# Patient Record
Sex: Female | Born: 1975 | Race: White | Hispanic: No | Marital: Single | Smoking: Former smoker
Health system: Southern US, Community
[De-identification: ages and names within clinical notes are randomized; demographics above are authoritative.]

## PROBLEM LIST (undated history)

## (undated) DIAGNOSIS — M549 Dorsalgia, unspecified: Secondary | ICD-10-CM

## (undated) DIAGNOSIS — K812 Acute cholecystitis with chronic cholecystitis: Secondary | ICD-10-CM

## (undated) DIAGNOSIS — G8929 Other chronic pain: Secondary | ICD-10-CM

## (undated) DIAGNOSIS — F1910 Other psychoactive substance abuse, uncomplicated: Secondary | ICD-10-CM

## (undated) DIAGNOSIS — K819 Cholecystitis, unspecified: Secondary | ICD-10-CM

## (undated) HISTORY — PX: BACK SURGERY: SHX140

---

## 2001-05-22 ENCOUNTER — Encounter: Admission: RE | Admit: 2001-05-22 | Discharge: 2001-05-22 | Payer: Self-pay | Admitting: Family Medicine

## 2001-05-22 ENCOUNTER — Encounter: Payer: Self-pay | Admitting: Family Medicine

## 2001-07-10 ENCOUNTER — Ambulatory Visit: Admission: RE | Admit: 2001-07-10 | Discharge: 2001-07-10 | Payer: Self-pay | Admitting: Neurosurgery

## 2001-10-27 ENCOUNTER — Ambulatory Visit (HOSPITAL_COMMUNITY): Admission: RE | Admit: 2001-10-27 | Discharge: 2001-10-27 | Payer: Self-pay | Admitting: Neurosurgery

## 2004-05-19 ENCOUNTER — Other Ambulatory Visit: Admission: RE | Admit: 2004-05-19 | Discharge: 2004-05-19 | Payer: Self-pay | Admitting: Obstetrics and Gynecology

## 2004-12-29 ENCOUNTER — Inpatient Hospital Stay (HOSPITAL_COMMUNITY): Admission: AD | Admit: 2004-12-29 | Discharge: 2004-12-29 | Payer: Self-pay | Admitting: Obstetrics and Gynecology

## 2005-01-02 ENCOUNTER — Inpatient Hospital Stay (HOSPITAL_COMMUNITY): Admission: AD | Admit: 2005-01-02 | Discharge: 2005-01-04 | Payer: Self-pay | Admitting: Obstetrics and Gynecology

## 2005-01-03 ENCOUNTER — Encounter (INDEPENDENT_AMBULATORY_CARE_PROVIDER_SITE_OTHER): Payer: Self-pay | Admitting: *Deleted

## 2005-01-12 ENCOUNTER — Encounter: Admission: RE | Admit: 2005-01-12 | Discharge: 2005-01-12 | Payer: Self-pay | Admitting: Family Medicine

## 2005-01-15 ENCOUNTER — Encounter: Admission: RE | Admit: 2005-01-15 | Discharge: 2005-01-15 | Payer: Self-pay | Admitting: Family Medicine

## 2009-11-02 ENCOUNTER — Emergency Department (HOSPITAL_COMMUNITY): Admission: EM | Admit: 2009-11-02 | Discharge: 2009-11-02 | Payer: Self-pay | Admitting: Emergency Medicine

## 2010-02-25 ENCOUNTER — Ambulatory Visit (HOSPITAL_COMMUNITY)
Admission: RE | Admit: 2010-02-25 | Discharge: 2010-02-25 | Disposition: A | Discharge status: Home / Self Care | Payer: Self-pay | Source: Ambulatory Visit | Attending: Psychiatry | Admitting: Psychiatry

## 2010-02-25 DIAGNOSIS — F39 Unspecified mood [affective] disorder: Secondary | ICD-10-CM | POA: Insufficient documentation

## 2010-04-07 LAB — BASIC METABOLIC PANEL
GFR calc Af Amer: >60 mL/min (ref >60)
GFR calc non Af Amer: >60 mL/min (ref >60)
Potassium: 3.8 mEq/L (ref 3.5–5.1)
Sodium: 140 mEq/L (ref 135–145)

## 2010-04-07 LAB — DIFFERENTIAL
Basophils Absolute: 0.1 10*3/uL (ref 0.0–0.1)
Lymphocytes Relative: 30 % (ref 12–46)
Monocytes Absolute: 0.6 10*3/uL (ref 0.1–1.0)
Monocytes Relative: 7 % (ref 3–12)
Neutro Abs: 5.4 10*3/uL (ref 1.7–7.7)

## 2010-04-07 LAB — CBC
HCT: 37.9 % (ref 36.0–46.0)
Hemoglobin: 13.1 g/dL (ref 12.0–15.0)
RDW: 12.6 % (ref 11.5–15.5)
WBC: 9.2 10*3/uL (ref 4.0–10.5)

## 2010-04-07 LAB — WOUND CULTURE

## 2010-04-07 LAB — ANAEROBIC CULTURE

## 2011-01-01 ENCOUNTER — Encounter: Payer: Self-pay | Admitting: *Deleted

## 2011-01-01 ENCOUNTER — Emergency Department (HOSPITAL_COMMUNITY)
Admission: EM | Admit: 2011-01-01 | Discharge: 2011-01-02 | Disposition: A | Discharge status: Home / Self Care | Payer: Self-pay | Attending: Emergency Medicine | Admitting: Emergency Medicine

## 2011-01-01 DIAGNOSIS — K8020 Calculus of gallbladder without cholecystitis without obstruction: Secondary | ICD-10-CM | POA: Insufficient documentation

## 2011-01-01 DIAGNOSIS — R112 Nausea with vomiting, unspecified: Secondary | ICD-10-CM | POA: Insufficient documentation

## 2011-01-01 DIAGNOSIS — R109 Unspecified abdominal pain: Secondary | ICD-10-CM | POA: Insufficient documentation

## 2011-01-01 HISTORY — DX: Dorsalgia, unspecified: M54.9

## 2011-01-01 HISTORY — DX: Other psychoactive substance abuse, uncomplicated: F19.10

## 2011-01-01 NOTE — ED Notes (Signed)
Pt assisted out of car to wheelchair- Pt report having this symptoms of abdominal pain "trapped gas" before usually belching helps- this time severe abd pain for several hours.  Denies nausea, vaginal problems and urinary problems

## 2011-01-02 ENCOUNTER — Emergency Department (HOSPITAL_COMMUNITY): Payer: Self-pay

## 2011-01-02 LAB — COMPREHENSIVE METABOLIC PANEL
Alkaline Phosphatase: 53 U/L (ref 39–117)
BUN: 10 mg/dL (ref 6–23)
CO2: 26 mEq/L (ref 19–32)
Chloride: 102 mEq/L (ref 96–112)
Creatinine, Ser: 0.89 mg/dL (ref 0.50–1.10)
GFR calc non Af Amer: 84 mL/min — ABNORMAL LOW (ref >90)
Glucose, Bld: 129 mg/dL — ABNORMAL HIGH (ref 70–99)
Potassium: 3.8 mEq/L (ref 3.5–5.1)
Total Bilirubin: 0.2 mg/dL — ABNORMAL LOW (ref 0.3–1.2)

## 2011-01-02 LAB — POCT PREGNANCY, URINE: Preg Test, Ur: NEGATIVE

## 2011-01-02 LAB — URINALYSIS, ROUTINE W REFLEX MICROSCOPIC
Glucose, UA: NEGATIVE mg/dL
Ketones, ur: 40 mg/dL — AB
Leukocytes, UA: NEGATIVE
Specific Gravity, Urine: 1.016 (ref 1.005–1.030)
pH: 6.5 (ref 5.0–8.0)

## 2011-01-02 LAB — DIFFERENTIAL
Lymphocytes Relative: 12 % (ref 12–46)
Lymphs Abs: 1.5 10*3/uL (ref 0.7–4.0)
Monocytes Absolute: 0.5 10*3/uL (ref 0.1–1.0)
Monocytes Relative: 4 % (ref 3–12)
Neutro Abs: 10.4 10*3/uL — ABNORMAL HIGH (ref 1.7–7.7)
Neutrophils Relative %: 84 % — ABNORMAL HIGH (ref 43–77)

## 2011-01-02 LAB — LIPASE, BLOOD: Lipase: 30 U/L (ref 11–59)

## 2011-01-02 LAB — CBC
HCT: 41.6 % (ref 36.0–46.0)
Hemoglobin: 14.4 g/dL (ref 12.0–15.0)
MCHC: 34.6 g/dL (ref 30.0–36.0)
RBC: 4.55 MIL/uL (ref 3.87–5.11)
WBC: 12.4 10*3/uL — ABNORMAL HIGH (ref 4.0–10.5)

## 2011-01-02 MED ORDER — FAMOTIDINE IN NACL 20-0.9 MG/50ML-% IV SOLN
20.0000 mg | Freq: Once | INTRAVENOUS | Status: AC
  Administered 2011-01-02: 20 mg via INTRAVENOUS
  Filled 2011-01-02: qty 50

## 2011-01-02 MED ORDER — HYDROMORPHONE HCL PF 1 MG/ML IJ SOLN
0.5000 mg | Freq: Once | INTRAMUSCULAR | Status: AC
  Administered 2011-01-02: 03:00:00 via INTRAVENOUS
  Filled 2011-01-02: qty 1

## 2011-01-02 MED ORDER — SUCRALFATE 1 G PO TABS
1.0000 g | ORAL_TABLET | Freq: Once | ORAL | Status: AC
  Administered 2011-01-02: 1 g via ORAL
  Filled 2011-01-02: qty 1

## 2011-01-02 MED ORDER — SODIUM CHLORIDE 0.9 % IV BOLUS (SEPSIS)
1000.0000 mL | Freq: Once | INTRAVENOUS | Status: AC
  Administered 2011-01-02: 1000 mL via INTRAVENOUS

## 2011-01-02 MED ORDER — ONDANSETRON 4 MG PO TBDP: 4.0000 mg | ORAL_TABLET | Freq: Four times a day (QID) | ORAL | Status: AC | PRN

## 2011-01-02 MED ORDER — ONDANSETRON HCL 4 MG/2ML IJ SOLN
4.0000 mg | Freq: Once | INTRAMUSCULAR | Status: AC
  Administered 2011-01-02: 4 mg via INTRAVENOUS
  Filled 2011-01-02: qty 2

## 2011-01-02 MED ORDER — HYDROMORPHONE HCL PF 1 MG/ML IJ SOLN
0.5000 mg | Freq: Once | INTRAMUSCULAR | Status: AC
  Administered 2011-01-02: 0.5 mg via INTRAVENOUS
  Filled 2011-01-02: qty 1

## 2011-01-02 MED ORDER — LORAZEPAM 2 MG/ML IJ SOLN
1.0000 mg | Freq: Once | INTRAMUSCULAR | Status: AC
  Administered 2011-01-02: 05:00:00 via INTRAVENOUS
  Filled 2011-01-02: qty 1

## 2011-01-02 NOTE — ED Provider Notes (Signed)
History     CSN: [id number] Arrival date & time: 01/01/2011 10:49 PM   First MD Initiated Contact with Patient 01/01/11 2355      Chief Complaint  Patient presents with  . Abdominal Pain    happened before usually belching relieves pain- onset for several hours  . Emesis    (Consider location/radiation/quality/duration/timing/severity/associated sxs/prior treatment) HPI Comments: Patient states that over the last, month she's had increasing episodes of upper abdominal pain and belching.  Usually she can burp several times and relieve the pressure.  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  Says that been her stable dose for several months.  Denies any other medication use.  States she ate a ham and cheese sandwich and shortly after that developed symptoms.  She does have her gallbladder.  Other than riding around and trying to relieve the pressure with position change.  Has tried the over-the-counter medications for gas relief  Patient is a 35 y.o. female presenting with abdominal pain and vomiting. The history is provided by the patient.  Abdominal Pain The primary symptoms of the illness include abdominal pain and nausea. The primary symptoms of the illness do not include vomiting or dysuria. The current episode started 3 to 5 hours ago. The onset of the illness was sudden. The problem has not changed since onset. The patient states that she believes she is currently not pregnant. The patient has not had a change in bowel habit. Additional symptoms associated with the illness include heartburn. Symptoms associated with the illness do not include constipation or urgency.  Emesis  Associated symptoms include abdominal pain.    Past Medical History  Diagnosis Date  . Back pain   . Substance abuse   . Substance abuse     Past Surgical History  Procedure Date  . Back surgery     No family history on  file.  History  Substance Use Topics  . Smoking status: Current Everyday Smoker  . Smokeless tobacco: Not on file  . Alcohol Use: No    OB History    Grav Para Term Preterm Abortions TAB SAB Ect Mult Living                  Review of Systems  Constitutional: Negative.   HENT: Negative.   Eyes: Negative.   Cardiovascular: Negative.   Gastrointestinal: Positive for heartburn, nausea and abdominal pain. Negative for vomiting, constipation and abdominal distention.  Genitourinary: Negative for dysuria and urgency.  Musculoskeletal: Negative.   Skin: Positive for pallor.  Neurological: Negative for dizziness.  Psychiatric/Behavioral: Negative.     Allergies  Review of patient's allergies indicates no known allergies.  Home Medications   Current Outpatient Rx  Name Route Sig Dispense Refill  . METHADONE HCL 10 MG PO TABS Oral Take 30 mg by mouth.        BP 93/63  Pulse 103  Temp(Src) 97.7 F (36.5 C) (Oral)  SpO2 100%  LMP 12/24/2010  Physical Exam  Constitutional: She appears well-developed and well-nourished.  HENT:  Head: Normocephalic.  Eyes: Pupils are equal, round, and reactive to light.  Neck: Normal range of motion.  Cardiovascular: Normal rate.   Pulmonary/Chest: Effort normal.  Abdominal: Soft. Bowel sounds are normal. She exhibits no distension and no mass. There is tenderness. There is no rebound and no guarding.  Musculoskeletal: Normal range of motion.  Skin: Skin is warm and dry.  Psychiatric:  She has a normal mood and affect.    ED Course  Procedures (including critical care time)  Labs Reviewed  URINALYSIS, ROUTINE W REFLEX MICROSCOPIC - Abnormal; Notable for the following:    APPearance CLOUDY (*)    Ketones, ur 40 (*)    All other components within normal limits  CBC - Abnormal; Notable for the following:    WBC 12.4 (*)    All other components within normal limits  DIFFERENTIAL - Abnormal; Notable for the following:    Neutrophils  Relative 84 (*)    Neutro Abs 10.4 (*)    All other components within normal limits  COMPREHENSIVE METABOLIC PANEL - Abnormal; Notable for the following:    Glucose, Bld 129 (*)    Total Bilirubin 0.2 (*)    GFR calc non Af Amer 84 (*)    All other components within normal limits  POCT PREGNANCY, URINE  POCT PREGNANCY, URINE   No results found.   No diagnosis found.    MDM  Will obtain CBC CMet evaluate for liver function to rule out choledocholithiasis.  We'll treat with Pepcid and Carafate for GERD        Arman Filter, NP 01/02/11 [phone]

## 2011-01-02 NOTE — ED Notes (Signed)
Ultrasound at bedside will attempt to get vitals upon ultrasound completion.

## 2011-01-02 NOTE — ED Provider Notes (Signed)
Medical screening examination/treatment/procedure(s) were performed by non-physician practitioner and as supervising physician I was immediately available for consultation/collaboration.   Jiovanna Frei L Bellamia Ferch, MD 01/02/11 0655 

## 2011-01-02 NOTE — ED Provider Notes (Signed)
7:57 AM  Patient care resumed from [facility] and Dr. Read Drivers  Ultrasound results are pending.  Pain is been managed by Dilaudid.  Patient reevaluated, vital signs normal, patient in no acute distress and hemodynamically stable.  She states she wants more pain medication.  Waiting for ultrasound results before giving her more pain medication.  As per Gails  Plan if the [place] is negative patient will be discharged without pain medication and only Zofran.  If positive surgery will be contacted.  8:22 AM Ultrasound results show cholelithiasis, but no biliary obstruction.  Patient will be discharged with recommendations to followup with Gen. Surgery.  Patient would not be discharged with pain medication due to being on methadone maintenance.  This was also discussed with the patient.  [place], [place] 01/02/11 [phone]

## 2011-01-04 NOTE — ED Provider Notes (Signed)
Medical screening examination/treatment/procedure(s) were performed by non-physician practitioner and as supervising physician I was immediately available for consultation/collaboration.   Hanley Seamen, MD 01/04/11 [phone]

## 2011-01-25 ENCOUNTER — Ambulatory Visit (INDEPENDENT_AMBULATORY_CARE_PROVIDER_SITE_OTHER): Payer: Self-pay | Admitting: Surgery

## 2011-01-30 ENCOUNTER — Encounter (INDEPENDENT_AMBULATORY_CARE_PROVIDER_SITE_OTHER): Payer: Self-pay | Admitting: Surgery

## 2011-12-26 ENCOUNTER — Telehealth (INDEPENDENT_AMBULATORY_CARE_PROVIDER_SITE_OTHER): Payer: Self-pay | Admitting: General Surgery

## 2011-12-26 ENCOUNTER — Encounter (HOSPITAL_COMMUNITY): Payer: Self-pay | Admitting: Emergency Medicine

## 2011-12-26 ENCOUNTER — Inpatient Hospital Stay (HOSPITAL_COMMUNITY)
Admission: EM | Admit: 2011-12-26 | Discharge: 2011-12-27 | DRG: 419 | Disposition: A | Discharge status: Home / Self Care | Payer: MEDICAID | Attending: General Surgery | Admitting: General Surgery

## 2011-12-26 ENCOUNTER — Emergency Department (HOSPITAL_COMMUNITY): Payer: Self-pay

## 2011-12-26 DIAGNOSIS — K810 Acute cholecystitis: Secondary | ICD-10-CM

## 2011-12-26 DIAGNOSIS — Z79899 Other long term (current) drug therapy: Secondary | ICD-10-CM

## 2011-12-26 DIAGNOSIS — K8010 Calculus of gallbladder with chronic cholecystitis without obstruction: Secondary | ICD-10-CM

## 2011-12-26 DIAGNOSIS — K8000 Calculus of gallbladder with acute cholecystitis without obstruction: Principal | ICD-10-CM | POA: Diagnosis present

## 2011-12-26 DIAGNOSIS — F17200 Nicotine dependence, unspecified, uncomplicated: Secondary | ICD-10-CM | POA: Diagnosis present

## 2011-12-26 DIAGNOSIS — K812 Acute cholecystitis with chronic cholecystitis: Secondary | ICD-10-CM | POA: Diagnosis present

## 2011-12-26 DIAGNOSIS — G8929 Other chronic pain: Secondary | ICD-10-CM | POA: Diagnosis present

## 2011-12-26 HISTORY — DX: Cholecystitis, unspecified: K81.9

## 2011-12-26 HISTORY — DX: Acute cholecystitis with chronic cholecystitis: K81.2

## 2011-12-26 HISTORY — DX: Other chronic pain: G89.29

## 2011-12-26 HISTORY — DX: Dorsalgia, unspecified: M54.9

## 2011-12-26 LAB — CBC WITH DIFFERENTIAL/PLATELET
Basophils Absolute: 0 10*3/uL (ref 0.0–0.1)
Eosinophils Relative: 1 % (ref 0–5)
Hemoglobin: 14 g/dL (ref 12.0–15.0)
Lymphocytes Relative: 33 % (ref 12–46)
Lymphs Abs: 2.7 10*3/uL (ref 0.7–4.0)
MCV: 91.1 fL (ref 78.0–100.0)
Monocytes Absolute: 0.6 10*3/uL (ref 0.1–1.0)
Neutro Abs: 4.9 10*3/uL (ref 1.7–7.7)
Platelets: 397 10*3/uL (ref 150–400)
RDW: 12 % (ref 11.5–15.5)

## 2011-12-26 LAB — URINALYSIS, ROUTINE W REFLEX MICROSCOPIC
Bilirubin Urine: NEGATIVE
Glucose, UA: NEGATIVE mg/dL
Leukocytes, UA: NEGATIVE
Protein, ur: NEGATIVE mg/dL
pH: 6.5 (ref 5.0–8.0)

## 2011-12-26 LAB — COMPREHENSIVE METABOLIC PANEL
ALT: 16 U/L (ref 0–35)
AST: 26 U/L (ref 0–37)
CO2: 28 mEq/L (ref 19–32)
Calcium: 10 mg/dL (ref 8.4–10.5)
Chloride: 99 mEq/L (ref 96–112)
GFR calc Af Amer: 85 mL/min — ABNORMAL LOW (ref >90)
GFR calc non Af Amer: 73 mL/min — ABNORMAL LOW (ref >90)
Glucose, Bld: 85 mg/dL (ref 70–99)
Sodium: 139 mEq/L (ref 135–145)
Total Bilirubin: 0.2 mg/dL — ABNORMAL LOW (ref 0.3–1.2)

## 2011-12-26 LAB — URINE MICROSCOPIC-ADD ON

## 2011-12-26 NOTE — ED Provider Notes (Signed)
History     CSN: [id number]  Arrival date & time 12/26/11  1624   First MD Initiated Contact with Patient 12/26/11 1955      Chief Complaint  Patient presents with  . Abdominal Pain    (Consider location/radiation/quality/duration/timing/severity/associated sxs/prior treatment) HPI Comments: Pateint with known gall stones started having pain yesterday that has been persistent Has tried numerous herbal therapies without relief of the symptoms.  This is the first time the pain has lasted this long   Patient is a 36 y.o. female presenting with abdominal pain. The history is provided by the patient.  Abdominal Pain The primary symptoms of the illness include abdominal pain and nausea. The primary symptoms of the illness do not include fever, shortness of breath, vomiting, diarrhea or dysuria. The current episode started yesterday. The onset of the illness was sudden. The problem has not changed since onset. The abdominal pain began yesterday. The abdominal pain has been unchanged since its onset. The abdominal pain is located in the RUQ and epigastric region.    Past Medical History  Diagnosis Date  . Back pain   . Substance abuse   . Substance abuse   . Cholecystitis     Past Surgical History  Procedure Date  . Back surgery   . Back surgery     History reviewed. No pertinent family history.  History  Substance Use Topics  . Smoking status: Light Tobacco Smoker  . Smokeless tobacco: Not on file  . Alcohol Use: No    OB History    Grav Para Term Preterm Abortions TAB SAB Ect Mult Living                  Review of Systems  Constitutional: Negative for fever.  Respiratory: Negative for shortness of breath.   Cardiovascular: Negative for chest pain.  Gastrointestinal: Positive for nausea and abdominal pain. Negative for vomiting and diarrhea.  Genitourinary: Negative for dysuria and flank pain.  Musculoskeletal: Negative for myalgias.  Skin: Positive for pallor.  Negative for rash and wound.  Neurological: Negative for dizziness and weakness.    Allergies  Review of patient's allergies indicates no known allergies.  Home Medications   Current Outpatient Rx  Name  Route  Sig  Dispense  Refill  . APPLE CIDER VINEGAR 300 MG PO TABS   Oral   Take 1 tablet by mouth daily.         . ASPIRIN EFFERVESCENT 325 MG PO TBEF   Oral   Take 325 mg by mouth every 6 (six) hours as needed. INDIGESTION         . CALCIUM CARBONATE ANTACID 500 MG PO CHEW   Oral   Chew 1 tablet by mouth daily.         Marland Kitchen FLAX SEED OIL 1000 MG PO CAPS   Oral   Take 1 capsule by mouth as needed. GALL STONES         . METHADONE HCL 10 MG PO TABS   Oral   Take 30 mg by mouth.            BP 109/63  Pulse 83  Temp 98.3 F (36.8 C) (Oral)  Resp 16  SpO2 100%  LMP 12/23/2011  Physical Exam  Constitutional: She appears well-developed and well-nourished.  HENT:  Head: Normocephalic.  Eyes: Pupils are equal, round, and reactive to light.  Neck: Normal range of motion.  Cardiovascular: Normal rate.   Pulmonary/Chest: Effort normal.  Abdominal: Soft.  She exhibits no distension. There is tenderness in the right upper quadrant and epigastric area.  Musculoskeletal: Normal range of motion.  Neurological: She is alert.  Skin: Skin is warm. There is pallor.    ED Course  Procedures (including critical care time)  Labs Reviewed  COMPREHENSIVE METABOLIC PANEL - Abnormal; Notable for the following:    Total Bilirubin 0.2 (*)     GFR calc non Af Amer 73 (*)     GFR calc Af Amer 85 (*)     All other components within normal limits  URINALYSIS, ROUTINE W REFLEX MICROSCOPIC - Abnormal; Notable for the following:    Hgb urine dipstick SMALL (*)     Ketones, ur TRACE (*)     All other components within normal limits  URINE MICROSCOPIC-ADD ON - Abnormal; Notable for the following:    Squamous Epithelial / LPF FEW (*)     Bacteria, UA FEW (*)     All other  components within normal limits  CBC WITH DIFFERENTIAL  AMYLASE  LIPASE, BLOOD  PREGNANCY, URINE   US Abdomen Complete  12/26/2011  *RADIOLOGY REPORT*  Clinical Data:  Right upper quadrant pain  COMPLETE ABDOMINAL ULTRASOUND  Comparison:  01/02/2011  Findings:  Gallbladder:  Gallbladder is filled with stones.  Positive sonographic Murphy's sign.  Gallbladder wall is 4.8 mm which may reflect acute cholecystitis.  Common bile duct:  2.9 mm  Liver:  No focal lesion identified.  Within normal limits in parenchymal echogenicity.  IVC:  Appears normal.  Pancreas:  No focal abnormality seen.  Spleen:  5.7 cm  Right Kidney:  9.1 cm.  Normal  Left Kidney:  9.5 cm.  Normal  Abdominal aorta:  No aneurysm identified.  IMPRESSION: Cholelithiasis with gallbladder wall thickening and positive sonographic Murphy's sign.  This suggests acute cholecystitis.   Original Report Authenticated By: Janeece Riggers, M.D.      1. Cholecystitis, acute       MDM   Labs reviewed no elevated white count or LFT's will ultrasound to evaluate GB wall         Arman Filter, NP 12/27/11 0007

## 2011-12-26 NOTE — H&P (Signed)
Emily Macdonald is an 36 y.o. female.   Chief Complaint: abdominal pain HPI: patient is a 36 year old female who initially was evaluated for an episode of acute upper aabdominal pain about one year ago. She was found to have gallstones and cholecystectomy was recommended at that time but she was worried about the surgery and did not follow through with this. She did okay for a number of months but about 3 months ago began having episodic pressure like and sharp pain in her epigastrium and right upper quadrant. These episodes have become more severe and frequent she has had 3 fairly severe episodes in the last 3 days. The most recent episode started yesterday and persisted into today and she presented to the was a long emergency room. She describes pressure-like pain in her epigastrium and beneath the right rib cage radiating to her back. This is associated with nausea and some reflux-like symptoms but no vomiting. No fever chills or jaundice. The patient currently is feeling somewhat better after pain medication in the emergency room.  Past Medical History  Diagnosis Date  . Back pain   . Substance abuse   . Substance abuse   . Cholecystitis     Past Surgical History  Procedure Date  . Back surgery   . Back surgery     History reviewed. No pertinent family history. Social History:  reports that she has been smoking.  She does not have any smokeless tobacco history on file. She reports that she does not drink alcohol or use illicit drugs.  Allergies: No Known Allergies   No current facility-administered medications for this encounter.   Current Outpatient Prescriptions  Medication Sig Dispense Refill  . Apple Cider Vinegar 300 MG TABS Take 1 tablet by mouth daily.      Marland Kitchen aspirin-sod bicarb-citric acid (ALKA-SELTZER) 325 MG TBEF Take 325 mg by mouth every 6 (six) hours as needed. INDIGESTION      . calcium carbonate (TUMS - DOSED IN MG ELEMENTAL CALCIUM) 500 MG chewable tablet Chew 1  tablet by mouth daily.      . Flaxseed, Linseed, (FLAX SEED OIL) 1000 MG CAPS Take 1 capsule by mouth as needed. GALL STONES      . methadone (DOLOPHINE) 10 MG tablet Take 30 mg by mouth.          Results for orders placed during the hospital encounter of 12/26/11 (from the past 48 hour(s))  URINALYSIS, ROUTINE W REFLEX MICROSCOPIC     Status: Abnormal   Collection Time   12/26/11  5:50 PM      Component Value Range Comment   Color, Urine YELLOW  YELLOW    APPearance CLEAR  CLEAR    Specific Gravity, Urine 1.009  1.005 - 1.030    pH 6.5  5.0 - 8.0    Glucose, UA NEGATIVE  NEGATIVE mg/dL    Hgb urine dipstick SMALL (*) NEGATIVE    Bilirubin Urine NEGATIVE  NEGATIVE    Ketones, ur TRACE (*) NEGATIVE mg/dL    Protein, ur NEGATIVE  NEGATIVE mg/dL    Urobilinogen, UA 0.2  0.0 - 1.0 mg/dL    Nitrite NEGATIVE  NEGATIVE    Leukocytes, UA NEGATIVE  NEGATIVE   PREGNANCY, URINE     Status: Normal   Collection Time   12/26/11  5:50 PM      Component Value Range Comment   Preg Test, Ur NEGATIVE  NEGATIVE   URINE MICROSCOPIC-ADD ON     Status: Abnormal  Collection Time   12/26/11  5:50 PM      Component Value Range Comment   Squamous Epithelial / LPF FEW (*) RARE    RBC / HPF 0-2  <3 RBC/hpf    Bacteria, UA FEW (*) RARE   CBC WITH DIFFERENTIAL     Status: Normal   Collection Time   12/26/11  5:55 PM      Component Value Range Comment   WBC 8.3  4.0 - 10.5 K/uL    RBC 4.47  3.87 - 5.11 MIL/uL    Hemoglobin 14.0  12.0 - 15.0 g/dL    HCT 16.1  09.6 - 04.5 %    MCV 91.1  78.0 - 100.0 fL    MCH 31.3  26.0 - 34.0 pg    MCHC 34.4  30.0 - 36.0 g/dL    RDW 40.9  81.1 - 91.4 %    Platelets 397  150 - 400 K/uL    Neutrophils Relative 59  43 - 77 %    Neutro Abs 4.9  1.7 - 7.7 K/uL    Lymphocytes Relative 33  12 - 46 %    Lymphs Abs 2.7  0.7 - 4.0 K/uL    Monocytes Relative 7  3 - 12 %    Monocytes Absolute 0.6  0.1 - 1.0 K/uL    Eosinophils Relative 1  0 - 5 %    Eosinophils Absolute 0.1   0.0 - 0.7 K/uL    Basophils Relative 1  0 - 1 %    Basophils Absolute 0.0  0.0 - 0.1 K/uL   COMPREHENSIVE METABOLIC PANEL     Status: Abnormal   Collection Time   12/26/11  5:55 PM      Component Value Range Comment   Sodium 139  135 - 145 mEq/L    Potassium 3.7  3.5 - 5.1 mEq/L    Chloride 99  96 - 112 mEq/L    CO2 28  19 - 32 mEq/L    Glucose, Bld 85  70 - 99 mg/dL    BUN 8  6 - 23 mg/dL    Creatinine, Ser 7.82  0.50 - 1.10 mg/dL    Calcium 95.6  8.4 - 10.5 mg/dL    Total Protein 8.2  6.0 - 8.3 g/dL    Albumin 4.9  3.5 - 5.2 g/dL    AST 26  0 - 37 U/L    ALT 16  0 - 35 U/L    Alkaline Phosphatase 59  39 - 117 U/L    Total Bilirubin 0.2 (*) 0.3 - 1.2 mg/dL    GFR calc non Af Amer 73 (*) >90 mL/min    GFR calc Af Amer 85 (*) >90 mL/min   AMYLASE     Status: Normal   Collection Time   12/26/11  5:55 PM      Component Value Range Comment   Amylase 37  0 - 105 U/L   LIPASE, BLOOD     Status: Normal   Collection Time   12/26/11  5:55 PM      Component Value Range Comment   Lipase 25  11 - 59 U/L    US Abdomen Complete  12/26/2011  *RADIOLOGY REPORT*  Clinical Data:  Right upper quadrant pain  COMPLETE ABDOMINAL ULTRASOUND  Comparison:  01/02/2011  Findings:  Gallbladder:  Gallbladder is filled with stones.  Positive sonographic Murphy's sign.  Gallbladder wall is 4.8 mm which may reflect acute cholecystitis.  Common bile duct:  2.9 mm  Liver:  No focal lesion identified.  Within normal limits in parenchymal echogenicity.  IVC:  Appears normal.  Pancreas:  No focal abnormality seen.  Spleen:  5.7 cm  Right Kidney:  9.1 cm.  Normal  Left Kidney:  9.5 cm.  Normal  Abdominal aorta:  No aneurysm identified.  IMPRESSION: Cholelithiasis with gallbladder wall thickening and positive sonographic Murphy's sign.  This suggests acute cholecystitis.   Original Report Authenticated By: Janeece Riggers, M.D.     ROS  Blood pressure 109/63, pulse 83, temperature 98.3 F (36.8 C), temperature source  Oral, resp. rate 16, last menstrual period 12/23/2011, SpO2 100.00%. Physical Exam  General: Alert, thin Caucasian female, in no distress Skin: Warm and dry without rash or infection. HEENT: No palpable masses or thyromegaly. Sclera nonicteric. Pupils equal round and reactive. Oropharynx clear. Lymph nodes: No cervical, supraclavicular, or inguinal nodes palpable. Lungs: Breath sounds clear and equal without increased work of breathing Cardiovascular: Regular rate and rhythm without murmur. No JVD or edema. Peripheral pulses intact. Abdomen: Nondistended. Normal bowel sounds. There is significant tenderness in the epigastrium and right upper quadrant with some guarding. No masses palpable. No organomegaly. No palpable hernias. Extremities: No edema or joint swelling or deformity. No chronic venous stasis changes. Neurologic: Alert and fully oriented. Gait normal.   Assessment/Plan Recurrent and worsening epigastric and right upper quadrant abdominal pain consistent with biliary tract disease. She now has persistent pain and tenderness on exam and ultrasound shows evidence of early acute cholecystitis. I've recommended hospitalization and urgent laparoscopic cholecystectomy with cholangiogram. We discussed the indications for the procedure, its nature, and risks of bleeding, infection, intestinal or bile duct injury, bile leak, and possible need for open procedure. She agrees will be admitted for urgent cholecystectomy.  Derrica Sieg T 12/26/2011, 10:48 PM

## 2011-12-26 NOTE — Telephone Encounter (Signed)
Pt called requesting advice on uncontrolled ruq pain "from gallstones". She was seen approximately 1 year ago in er and told she had gallstones. She has had three episodes of pain since then and has been in pain since yesterday, pain makes her nauseated. No fever that she is aware of. I advised her to go to [facility] for further evaluation and possible testing/gy

## 2011-12-26 NOTE — ED Notes (Signed)
Patient with history of cholecystitis returns with upper right quadrant pain radiating to back.  Patient also reports chills, nausea but no vomiting.  Patient rates pain as an 8.

## 2011-12-26 NOTE — ED Notes (Signed)
US at bedside

## 2011-12-27 ENCOUNTER — Encounter (HOSPITAL_COMMUNITY): Admission: EM | Disposition: A | Discharge status: Home / Self Care | Payer: Self-pay | Source: Home / Self Care

## 2011-12-27 ENCOUNTER — Inpatient Hospital Stay (HOSPITAL_COMMUNITY): Payer: MEDICAID

## 2011-12-27 ENCOUNTER — Encounter (HOSPITAL_COMMUNITY): Payer: Self-pay | Admitting: Anesthesiology

## 2011-12-27 ENCOUNTER — Encounter (HOSPITAL_COMMUNITY): Payer: Self-pay

## 2011-12-27 ENCOUNTER — Inpatient Hospital Stay (HOSPITAL_COMMUNITY): Payer: MEDICAID | Admitting: Anesthesiology

## 2011-12-27 DIAGNOSIS — K812 Acute cholecystitis with chronic cholecystitis: Secondary | ICD-10-CM

## 2011-12-27 DIAGNOSIS — G8929 Other chronic pain: Secondary | ICD-10-CM

## 2011-12-27 HISTORY — DX: Other chronic pain: G89.29

## 2011-12-27 HISTORY — PX: CHOLECYSTECTOMY: SHX55

## 2011-12-27 HISTORY — DX: Acute cholecystitis with chronic cholecystitis: K81.2

## 2011-12-27 LAB — MRSA PCR SCREENING: MRSA by PCR: NEGATIVE

## 2011-12-27 SURGERY — LAPAROSCOPIC CHOLECYSTECTOMY WITH INTRAOPERATIVE CHOLANGIOGRAM
Anesthesia: General | Site: Abdomen | Wound class: Clean Contaminated

## 2011-12-27 MED ORDER — PANTOPRAZOLE SODIUM 40 MG IV SOLR
40.0000 mg | Freq: Every day | INTRAVENOUS | Status: DC
  Filled 2011-12-27: qty 40

## 2011-12-27 MED ORDER — FENTANYL CITRATE 0.05 MG/ML IJ SOLN
INTRAMUSCULAR | Status: DC | PRN
  Administered 2011-12-27: 50 ug via INTRAVENOUS
  Administered 2011-12-27 (×2): 100 ug via INTRAVENOUS

## 2011-12-27 MED ORDER — DEXAMETHASONE SODIUM PHOSPHATE 10 MG/ML IJ SOLN
INTRAMUSCULAR | Status: DC | PRN
  Administered 2011-12-27: 10 mg via INTRAVENOUS

## 2011-12-27 MED ORDER — LIDOCAINE HCL (CARDIAC) 20 MG/ML IV SOLN
INTRAVENOUS | Status: DC | PRN
  Administered 2011-12-27: 75 mg via INTRAVENOUS

## 2011-12-27 MED ORDER — ACETAMINOPHEN 325 MG PO TABS: 650.0000 mg | ORAL_TABLET | Freq: Four times a day (QID) | ORAL | Status: DC | PRN

## 2011-12-27 MED ORDER — LACTATED RINGERS IV SOLN: INTRAVENOUS | Status: DC

## 2011-12-27 MED ORDER — GLYCOPYRROLATE 0.2 MG/ML IJ SOLN
INTRAMUSCULAR | Status: DC | PRN
  Administered 2011-12-27: .5 mg via INTRAVENOUS

## 2011-12-27 MED ORDER — ROCURONIUM BROMIDE 100 MG/10ML IV SOLN
INTRAVENOUS | Status: DC | PRN
  Administered 2011-12-27: 50 mg via INTRAVENOUS

## 2011-12-27 MED ORDER — HYDROMORPHONE HCL PF 1 MG/ML IJ SOLN
INTRAMUSCULAR | Status: AC
  Filled 2011-12-27: qty 1

## 2011-12-27 MED ORDER — LACTATED RINGERS IR SOLN
Status: DC | PRN
  Administered 2011-12-27: 1000 mL

## 2011-12-27 MED ORDER — ONDANSETRON HCL 4 MG/2ML IJ SOLN: 4.0000 mg | Freq: Four times a day (QID) | INTRAMUSCULAR | Status: DC | PRN

## 2011-12-27 MED ORDER — HYDROMORPHONE HCL PF 1 MG/ML IJ SOLN
0.2500 mg | INTRAMUSCULAR | Status: DC | PRN
  Administered 2011-12-27 (×4): 0.5 mg via INTRAVENOUS

## 2011-12-27 MED ORDER — INFLUENZA VIRUS VACC SPLIT PF IM SUSP
0.5000 mL | Freq: Once | INTRAMUSCULAR | Status: DC
  Filled 2011-12-27: qty 0.5

## 2011-12-27 MED ORDER — ACETAMINOPHEN 10 MG/ML IV SOLN
1000.0000 mg | Freq: Four times a day (QID) | INTRAVENOUS | Status: DC
  Administered 2011-12-27: 1000 mg via INTRAVENOUS
  Filled 2011-12-27 (×2): qty 100

## 2011-12-27 MED ORDER — HYDROMORPHONE HCL PF 1 MG/ML IJ SOLN
INTRAMUSCULAR | Status: DC | PRN
  Administered 2011-12-27 (×4): 0.5 mg via INTRAVENOUS

## 2011-12-27 MED ORDER — KCL IN DEXTROSE-NACL 20-5-0.9 MEQ/L-%-% IV SOLN
INTRAVENOUS | Status: DC
  Administered 2011-12-27 (×2): via INTRAVENOUS
  Filled 2011-12-27 (×3): qty 1000

## 2011-12-27 MED ORDER — IOHEXOL 300 MG/ML  SOLN
INTRAMUSCULAR | Status: AC
  Filled 2011-12-27: qty 1

## 2011-12-27 MED ORDER — OXYCODONE HCL 5 MG PO TABS
5.0000 mg | ORAL_TABLET | ORAL | Status: DC | PRN
Start: 2011-12-27 — End: 2016-08-31

## 2011-12-27 MED ORDER — KETOROLAC TROMETHAMINE 15 MG/ML IJ SOLN
15.0000 mg | Freq: Once | INTRAMUSCULAR | Status: AC
  Administered 2011-12-27: 15 mg via INTRAVENOUS
  Filled 2011-12-27: qty 1

## 2011-12-27 MED ORDER — BUPIVACAINE LIPOSOME 1.3 % IJ SUSP
20.0000 mL | Freq: Once | INTRAMUSCULAR | Status: DC
  Filled 2011-12-27: qty 20

## 2011-12-27 MED ORDER — ONDANSETRON HCL 4 MG/2ML IJ SOLN
INTRAMUSCULAR | Status: DC | PRN
  Administered 2011-12-27: 4 mg via INTRAVENOUS

## 2011-12-27 MED ORDER — LORAZEPAM 2 MG/ML IJ SOLN: 1.0000 mg | INTRAMUSCULAR | Status: DC | PRN

## 2011-12-27 MED ORDER — LORAZEPAM 2 MG/ML IJ SOLN: 1.0000 mg | Freq: Once | INTRAMUSCULAR | Status: DC

## 2011-12-27 MED ORDER — LACTATED RINGERS IV SOLN
INTRAVENOUS | Status: DC
  Administered 2011-12-27 (×2): 1000 mL via INTRAVENOUS

## 2011-12-27 MED ORDER — PROPOFOL 10 MG/ML IV BOLUS
INTRAVENOUS | Status: DC | PRN
  Administered 2011-12-27: 200 mg via INTRAVENOUS

## 2011-12-27 MED ORDER — NEOSTIGMINE METHYLSULFATE 1 MG/ML IJ SOLN
INTRAMUSCULAR | Status: DC | PRN
  Administered 2011-12-27: 3.5 mg via INTRAVENOUS

## 2011-12-27 MED ORDER — ACETAMINOPHEN 10 MG/ML IV SOLN
INTRAVENOUS | Status: AC
  Filled 2011-12-27: qty 100

## 2011-12-27 MED ORDER — DEXTROSE 5 % IV SOLN
1.0000 g | Freq: Every day | INTRAVENOUS | Status: DC
  Administered 2011-12-27: 1 g via INTRAVENOUS
  Filled 2011-12-27 (×2): qty 10

## 2011-12-27 MED ORDER — ACETAMINOPHEN 10 MG/ML IV SOLN
INTRAVENOUS | Status: DC | PRN
  Administered 2011-12-27: 1000 mg via INTRAVENOUS

## 2011-12-27 MED ORDER — IOHEXOL 300 MG/ML  SOLN
INTRAMUSCULAR | Status: DC | PRN
  Administered 2011-12-27: 6 mL via INTRAVENOUS

## 2011-12-27 MED ORDER — PROMETHAZINE HCL 25 MG/ML IJ SOLN: 6.2500 mg | INTRAMUSCULAR | Status: DC | PRN

## 2011-12-27 MED ORDER — PNEUMOCOCCAL VAC POLYVALENT 25 MCG/0.5ML IJ INJ
0.5000 mL | INJECTION | Freq: Once | INTRAMUSCULAR | Status: DC
  Filled 2011-12-27: qty 0.5

## 2011-12-27 MED ORDER — OXYCODONE HCL 5 MG PO TABS
5.0000 mg | ORAL_TABLET | ORAL | Status: DC | PRN
  Administered 2011-12-27 (×2): 5 mg via ORAL
  Filled 2011-12-27 (×2): qty 1

## 2011-12-27 MED ORDER — MORPHINE SULFATE 2 MG/ML IJ SOLN
2.0000 mg | INTRAMUSCULAR | Status: DC | PRN
  Administered 2011-12-27 (×2): 2 mg via INTRAVENOUS
  Administered 2011-12-27: 4 mg via INTRAVENOUS
  Filled 2011-12-27 (×2): qty 1
  Filled 2011-12-27: qty 2

## 2011-12-27 MED ORDER — METHADONE HCL 10 MG PO TABS
30.0000 mg | ORAL_TABLET | Freq: Every day | ORAL | Status: DC
  Administered 2011-12-27: 30 mg via ORAL
  Filled 2011-12-27: qty 3

## 2011-12-27 MED ORDER — BUPIVACAINE LIPOSOME 1.3 % IJ SUSP
INTRAMUSCULAR | Status: DC | PRN
  Administered 2011-12-27: 20 mL

## 2011-12-27 MED ORDER — MIDAZOLAM HCL 5 MG/5ML IJ SOLN
INTRAMUSCULAR | Status: DC | PRN
  Administered 2011-12-27: 2 mg via INTRAVENOUS

## 2011-12-27 SURGICAL SUPPLY — 47 items
ADH SKN CLS APL DERMABOND .7 (GAUZE/BANDAGES/DRESSINGS) ×1
APL SKNCLS STERI-STRIP NONHPOA (GAUZE/BANDAGES/DRESSINGS) ×1
APPLIER CLIP 5 13 M/L LIGAMAX5 (MISCELLANEOUS)
APPLIER CLIP ROT 10 11.4 M/L (STAPLE) ×2
APR CLP MED LRG 11.4X10 (STAPLE) ×1
APR CLP MED LRG 5 ANG JAW (MISCELLANEOUS)
BAG SPEC RTRVL LRG 6X4 10 (ENDOMECHANICALS) ×1
BENZOIN TINCTURE PRP APPL 2/3 (GAUZE/BANDAGES/DRESSINGS) ×2 IMPLANT
CABLE HIGH FREQUENCY MONO STRZ (ELECTRODE) ×1 IMPLANT
CANISTER SUCTION 2500CC (MISCELLANEOUS) ×2 IMPLANT
CATH REDDICK CHOLANGI 4FR 50CM (CATHETERS) ×1 IMPLANT
CLIP APPLIE 5 13 M/L LIGAMAX5 (MISCELLANEOUS) IMPLANT
CLIP APPLIE ROT 10 11.4 M/L (STAPLE) IMPLANT
CLOTH BEACON ORANGE TIMEOUT ST (SAFETY) ×2 IMPLANT
COVER MAYO STAND STRL (DRAPES) ×2 IMPLANT
COVER SURGICAL LIGHT HANDLE (MISCELLANEOUS) ×2 IMPLANT
DECANTER SPIKE VIAL GLASS SM (MISCELLANEOUS) ×2 IMPLANT
DERMABOND ADVANCED (GAUZE/BANDAGES/DRESSINGS) ×1
DERMABOND ADVANCED .7 DNX12 (GAUZE/BANDAGES/DRESSINGS) IMPLANT
DRAPE C-ARM 42X72 X-RAY (DRAPES) ×2 IMPLANT
DRAPE LAPAROSCOPIC ABDOMINAL (DRAPES) ×2 IMPLANT
ELECT REM PT RETURN 9FT ADLT (ELECTROSURGICAL) ×2
ELECTRODE REM PT RTRN 9FT ADLT (ELECTROSURGICAL) ×1 IMPLANT
GLOVE BIOGEL M 8.0 STRL (GLOVE) ×2 IMPLANT
GOWN STRL NON-REIN LRG LVL3 (GOWN DISPOSABLE) ×2 IMPLANT
GOWN STRL REIN XL XLG (GOWN DISPOSABLE) ×4 IMPLANT
HEMOSTAT SURGICEL 4X8 (HEMOSTASIS) IMPLANT
IV CATH 14GX2 1/4 (CATHETERS) ×2 IMPLANT
KIT BASIN OR (CUSTOM PROCEDURE TRAY) ×2 IMPLANT
NS IRRIG 1000ML POUR BTL (IV SOLUTION) ×2 IMPLANT
POUCH SPECIMEN RETRIEVAL 10MM (ENDOMECHANICALS) ×2 IMPLANT
SCISSORS LAP 5X35 DISP (ENDOMECHANICALS) ×2 IMPLANT
SET IRRIG TUBING LAPAROSCOPIC (IRRIGATION / IRRIGATOR) ×2 IMPLANT
SLEEVE Z-THREAD 5X100MM (TROCAR) IMPLANT
SOLUTION ANTI FOG 6CC (MISCELLANEOUS) ×2 IMPLANT
STRIP CLOSURE SKIN 1/2X4 (GAUZE/BANDAGES/DRESSINGS) ×2 IMPLANT
SUT VIC AB 4-0 SH 18 (SUTURE) ×2 IMPLANT
SUT VICRYL 0 UR6 27IN ABS (SUTURE) ×2 IMPLANT
SYR 30ML LL (SYRINGE) ×2 IMPLANT
TOWEL OR 17X26 10 PK STRL BLUE (TOWEL DISPOSABLE) ×4 IMPLANT
TRAY LAP CHOLE (CUSTOM PROCEDURE TRAY) ×2 IMPLANT
TROCAR BLADELESS OPT 5 75 (ENDOMECHANICALS) ×4 IMPLANT
TROCAR XCEL BLUNT TIP 100MML (ENDOMECHANICALS) ×2 IMPLANT
TROCAR XCEL NON-BLD 11X100MML (ENDOMECHANICALS) IMPLANT
TROCAR Z-THREAD FIOS 11X100 BL (TROCAR) ×2 IMPLANT
TROCAR Z-THREAD FIOS 5X100MM (TROCAR) ×1 IMPLANT
TUBING INSUFFLATION 10FT LAP (TUBING) ×2 IMPLANT

## 2011-12-27 NOTE — Op Note (Signed)
Jerelyn Scott @date @  Procedure: Laparoscopic Cholecystectomy with intraoperative cholangiogram  Surgeon: Wenda Low, MD, FACS Asst:  none  Anes:  General  Drains: None  Findings: Severe acute and chronic cholecystitis  Description of Procedure: The patient was taken to OR 6 and given general anesthesia.  The patient was prepped with PCMX and draped sterilely. A time out was performed.  Access to the abdomen was achieved with open Hassan through the umbilicus.  Port placement included two fives and two 11mm trocars.    The gallbladder was visualized and the fundus was grasped and the gallbladder was elevated. Traction on the infundibulum allowed for successful demonstration of the critical view. Inflammatory changes were significant and were stripped away.  The cystic duct was identified and clipped up on the gallbladder and an incision was made in the cystic duct and the Reddick catheter was inserted after milking the cystic duct of any debris. A dynamic cholangiogram was performed which demonstrated a short cystic duct, air bubbles in the CBD (subsequently bubbled back up), intrahepatic filling and free flow into the duodenum.    The cystic duct was then triple clipped and divided, the cystic artery was double clipped and divided and then the gallbladder was removed from the gallbladder bed. Removal of the gallbladder from the gallbladder bed was without entering it.  The gallbladder was then placed in a bag and brought out through one of the 10 mm trocar sites. The gallbladder bed was inspected and no bleeding or bile leaks were seen.   Laparoscopic visualization was used when closing fascial defects for trocar sites.   Incisions were injected with Exparel and closed with 4-0 Vicryl and Dermabond on the skin.  Sponge and needle count were correct.    The patient was taken to the recovery room in satisfactory condition.

## 2011-12-27 NOTE — Progress Notes (Signed)
New Iv started by [facility], OR calling for patient now, informed has not yet had ativan, RN states she will give her something for anxiety if needed after patient talks with anesthesia.

## 2011-12-27 NOTE — Discharge Summary (Signed)
Physician Discharge Summary  Patient ID: Emily Macdonald MRN: [id number] DOB/AGE: [birth year] 36 y.o.  Admit date: 12/26/2011 Discharge date: 12/28/2011  Admission Diagnoses: Severe acute and chronic cholecystitis Chronic pain on Methadone Prior back surgery  Discharge Diagnoses: Same Principal Problem:  *Acute cholecystitis with chronic cholecystitis Active Problems:  Chronic back pain   PROCEDURES: Laparoscopic Cholecystectomy with intraoperative cholangiogram   Hospital Course: patient is a 36 year old female who initially was evaluated for an episode of acute upper aabdominal pain about one year ago. She was found to have gallstones and cholecystectomy was recommended at that time but she was worried about the surgery and did not follow through with this. She did okay for a number of months but about 3 months ago began having episodic pressure like and sharp pain in her epigastrium and right upper quadrant. These episodes have become more severe and frequent she has had 3 fairly severe episodes in the last 3 days. The most recent episode started yesterday and persisted into today and she presented to the was a long emergency room. She describes pressure-like pain in her epigastrium and beneath the right rib cage radiating to her back. This is associated with nausea and some reflux-like symptoms but no vomiting. No fever chills or jaundice. The patient currently is feeling somewhat better after pain medication in the emergency room. She was seen by Dr. Johna Sheriff and admitted, then seen the following AM by Dr. Daphine Deutscher who took her to surgery later the same day.  Post op she had significant problems with pain, but by 4 PM she was better and wanted to go home.  She was in fact discharged later that evening with instructions.  Condition on D/C:  iMPROVED    Disposition: 01-Home or Self Care     Medication List     As of 12/28/2011  3:00 PM    STOP taking these medications        Flax Seed Oil 1000 MG Caps      TAKE these medications         acetaminophen 325 MG tablet   Commonly known as: TYLENOL   Take 2 tablets (650 mg total) by mouth every 6 (six) hours as needed for pain.      Apple Cider Vinegar 300 MG Tabs   Take 1 tablet by mouth daily.      aspirin-sod bicarb-citric acid 325 MG Tbef   Commonly known as: ALKA-SELTZER   Take 325 mg by mouth every 6 (six) hours as needed. INDIGESTION      calcium carbonate 500 MG chewable tablet   Commonly known as: TUMS - dosed in mg elemental calcium   Chew 1 tablet by mouth daily.      methadone 10 MG tablet   Commonly known as: DOLOPHINE   Take 30 mg by mouth.      oxyCODONE 5 MG immediate release tablet   Commonly known as: Oxy IR/ROXICODONE   Take 1-3 tablets (5-15 mg total) by mouth every 4 (four) hours as needed.        Follow-up Information    Follow up with Valarie Merino, MD.   Contact information:   [address] [place] [address] [phone]          Signed: Sherrie George 12/28/2011, 3:00 PM

## 2011-12-27 NOTE — Progress Notes (Signed)
Dr. Daphine Deutscher on floor informed him patient waiting to go home until she sees how oxycodone will do, attempted full liquids but states causes pain, is still adamant that she wants to go home, MD states that is fine.

## 2011-12-27 NOTE — Progress Notes (Signed)
Spoke with Doristine Mango, PA informed her that patient states she is concerned that she takes methadone 30mg  po daily at home and has not had it today is worried about withdrawal. Patient also complains of anxiety, and sweating. One time order for ativan given for patient, Lanora Manis states she does not want methadone or too much ativan given close to anesthesia. Also patient requesting new IV states on to RAC hurts in bend of arm, blood return noted, will given ativan when new IV access available.

## 2011-12-27 NOTE — Progress Notes (Signed)
Emily Macdonald and Dr. Daphine Deutscher in room to see patient sttes Emily Macdonald order methadone, IV tylenol and IV toradol. Per Dr. Daphine Deutscher do not give ativan at this time. Patient appears more calm, states pain some better but continues to sit on floor and meditate.

## 2011-12-27 NOTE — Progress Notes (Signed)
Pt discharged to home with dad provided discharge instructions and prescriptions along with handouts. Pt verbalized understanding of discharge information. Pt stable. Pt transported by [facility] IV removed and documented. Annitta Needs, RN

## 2011-12-27 NOTE — Anesthesia Preprocedure Evaluation (Signed)
Anesthesia Evaluation  Patient identified by MRN, date of birth, ID band Patient awake    Reviewed: Allergy & Precautions, H&P , NPO status , Patient's Chart, lab work & pertinent test results  Airway Mallampati: II TM Distance: >3 FB Neck ROM: Full    Dental  (+) Poor Dentition, Dental Advisory Given and Missing,    Pulmonary Current Smoker,  breath sounds clear to auscultation  Pulmonary exam normal       Cardiovascular negative cardio ROS  Rhythm:Regular Rate:Normal     Neuro/Psych Chronic low back pain; methadone dependent negative neurological ROS  negative psych ROS   GI/Hepatic negative GI ROS, Neg liver ROS,   Endo/Other  negative endocrine ROS  Renal/GU negative Renal ROS  negative genitourinary   Musculoskeletal negative musculoskeletal ROS (+)   Abdominal   Peds negative pediatric ROS (+)  Hematology negative hematology ROS (+)   Anesthesia Other Findings   Reproductive/Obstetrics negative OB ROS                           Anesthesia Physical Anesthesia Plan  ASA: II  Anesthesia Plan: General   Post-op Pain Management:    Induction: Intravenous  Airway Management Planned: Oral ETT  Additional Equipment:   Intra-op Plan:   Post-operative Plan: Extubation in OR  Informed Consent: I have reviewed the patients History and Physical, chart, labs and discussed the procedure including the risks, benefits and alternatives for the proposed anesthesia with the patient or authorized representative who has indicated his/her understanding and acceptance.   Dental advisory given  Plan Discussed with: CRNA  Anesthesia Plan Comments:         Anesthesia Quick Evaluation

## 2011-12-27 NOTE — Progress Notes (Signed)
Patient and father asking if she can go home tonight, discussed that patient should have adequate pain control, tolerating regular diet, and be able to urinate and ambulate before discharge, patient and father adament that she should go home and states she will have better pain control there by meditating. Director Lenice Pressman in to speak with patient and Dr. Daphine Deutscher paged, states he will send Will Marlyne Beards, PA up to evaluate.

## 2011-12-27 NOTE — Progress Notes (Signed)
Patient arrived to floor, doing small breaths, awake, states it hurts to breathe, per pacu RN a total of dialudid 2mg  given in pacu. Called to room by patient father states patient believes she is having a heart attack, patient in room screaming, states she is having severe sharp pains in shoulder blades and under right rib cage. HR regular and Breath sounds normal. Informed patient and father this is gas pains, morphine given and patient ambulated, patient continues to do shallow rapid respirations and is hunched over. Will continue to stay with patient and monitor.

## 2011-12-27 NOTE — Progress Notes (Signed)
Patient states is continueing to have shoulder pain, and may decide to stay, Will Marlyne Beards, Pa states if patient decides to stay until morning it is ok.

## 2011-12-27 NOTE — Progress Notes (Signed)
Paged Clance Boll, PA for [facility], patient now sitting on knees and leaning over on bed, father at bedside, patient states this is how she is most comfortable and she is meditating.

## 2011-12-27 NOTE — Progress Notes (Signed)
Medicated patient for pain, states has urinated. attemping to eat full liquids and states is having abdominal pain afterwards, spoke with patient about staying the night to be sure diet can be tolerated and pain is controlled. States she still wants to go home but will stay until later and see how oxycodone does for pain control.

## 2011-12-27 NOTE — Progress Notes (Signed)
Spoke to [place], informed her of situation and that patient appears to be having a lot of anxiety, father very anxious as well, orders given for ativan and states that [employer], Pa for surgery will be up soon. At this time Will now on floor and informed of situation, states to hold off on giving ativan and Dr. Daphine Deutscher will be up shortly will see about restarting ativan instead.

## 2011-12-27 NOTE — Progress Notes (Signed)
We saw pt around 3 Pm she was on her knee's in pain, and unrelieved with Morphine and dilaudid in PACU.  We gave her IV Toradol and tylenol and now she wants to go home.  Discussed with Dr. Daphine Deutscher and he agrees with allowing her to go home.  We discussed tylenol, ibuprofen and plans to give her some oxycodone for home use.  She thinks she can do it with meditation. I will have her follow up with Dr. Daphine Deutscher in 2 weeks.

## 2011-12-27 NOTE — Interval H&P Note (Signed)
History and Physical Interval Note:  12/27/2011 10:26 AM  Emily Macdonald  has presented today for surgery, with the diagnosis of gallstones  The various methods of treatment have been discussed with the patient and family. After consideration of risks, benefits and other options for treatment, the patient has consented to  Procedure(s) (LRB) with comments: LAPAROSCOPIC CHOLECYSTECTOMY WITH INTRAOPERATIVE CHOLANGIOGRAM (N/A) as a surgical intervention .  The patient's history has been reviewed, patient examined, no change in status, stable for surgery.  I have reviewed the patient's chart and labs.  Questions were answered to the patient's satisfaction.     Brandley Aldrete B

## 2011-12-27 NOTE — Anesthesia Postprocedure Evaluation (Signed)
Anesthesia Post Note  Patient: Emily Macdonald  Procedure(s) Performed: Procedure(s) (LRB): LAPAROSCOPIC CHOLECYSTECTOMY WITH INTRAOPERATIVE CHOLANGIOGRAM (N/A)  Anesthesia type: General  Patient location: PACU  Post pain: Pain level controlled  Post assessment: Post-op Vital signs reviewed  Last Vitals:  Filed Vitals:   12/27/11 1347  BP: 96/59  Pulse: 88  Temp: 36.9 C  Resp: 20    Post vital signs: Reviewed  Level of consciousness: sedated  Complications: No apparent anesthesia complications

## 2011-12-27 NOTE — Progress Notes (Signed)
Patient states if her mother or father calls it is ok to speak with them about her care.

## 2011-12-27 NOTE — Progress Notes (Signed)
Patient ID: Emily Macdonald, female   DOB: 10-Oct-1975, 36 y.o.   MRN: [id number]    Subjective: Pt still with pain in RUQ and some nausea, ready for surgery  Objective: Vital signs in last 24 hours: Temp:  [98.3 F (36.8 C)-98.7 F (37.1 C)] 98.7 F (37.1 C) (12/04 0606) Pulse Rate:  [76-103] 81  (12/04 0606) Resp:  [16-20] 18  (12/04 0606) BP: (93-118)/(52-69) 99/63 mmHg (12/04 0606) SpO2:  [98 %-100 %] 99 % (12/04 0606) Weight:  [125 lb (56.7 kg)] 125 lb (56.7 kg) (12/04 0458) Last BM Date: 12/26/11  Intake/Output from previous day:   Intake/Output this shift:    PE: Abd: soft but very tender in RUQ and Epigastric, +BS  Lab Results:   Basename 12/26/11 1755  WBC 8.3  HGB 14.0  HCT 40.7  PLT 397   BMET  Basename 12/26/11 1755  NA 139  K 3.7  CL 99  CO2 28  GLUCOSE 85  BUN 8  CREATININE 0.99  CALCIUM 10.0   PT/INR No results found for this basename: LABPROT:2,INR:2 in the last 72 hours CMP     Component Value Date/Time   NA 139 12/26/2011 1755   K 3.7 12/26/2011 1755   CL 99 12/26/2011 1755   CO2 28 12/26/2011 1755   GLUCOSE 85 12/26/2011 1755   BUN 8 12/26/2011 1755   CREATININE 0.99 12/26/2011 1755   CALCIUM 10.0 12/26/2011 1755   PROT 8.2 12/26/2011 1755   ALBUMIN 4.9 12/26/2011 1755   AST 26 12/26/2011 1755   ALT 16 12/26/2011 1755   ALKPHOS 59 12/26/2011 1755   BILITOT 0.2* 12/26/2011 1755   GFRNONAA 73* 12/26/2011 1755   GFRAA 85* 12/26/2011 1755   Lipase     Component Value Date/Time   LIPASE 25 12/26/2011 1755       Studies/Results: US Abdomen Complete  12/26/2011  *RADIOLOGY REPORT*  Clinical Data:  Right upper quadrant pain  COMPLETE ABDOMINAL ULTRASOUND  Comparison:  01/02/2011  Findings:  Gallbladder:  Gallbladder is filled with stones.  Positive sonographic Murphy's sign.  Gallbladder wall is 4.8 mm which may reflect acute cholecystitis.  Common bile duct:  2.9 mm  Liver:  No focal lesion identified.  Within normal limits in parenchymal  echogenicity.  IVC:  Appears normal.  Pancreas:  No focal abnormality seen.  Spleen:  5.7 cm  Right Kidney:  9.1 cm.  Normal  Left Kidney:  9.5 cm.  Normal  Abdominal aorta:  No aneurysm identified.  IMPRESSION: Cholelithiasis with gallbladder wall thickening and positive sonographic Murphy's sign.  This suggests acute cholecystitis.   Original Report Authenticated By: Janeece Riggers, M.D.     Anti-infectives: Anti-infectives     Start     Dose/Rate Route Frequency Ordered Stop   12/27/11 0500   cefTRIAXone (ROCEPHIN) 1 g in dextrose 5 % 50 mL IVPB        1 g 100 mL/hr over 30 Minutes Intravenous Daily 12/27/11 0447             Assessment/Plan 1. Bilary colic/cholelithiasis/early acute cholecystitis: pt with unrelenting abdominal pain, no evidence of bilary obstruction, will plan lap chole today if poss.  Pt would like to go home as soon as possible as she is a single mom.  LOS: 1 day    Rowe Warman 12/27/2011

## 2011-12-27 NOTE — Transfer of Care (Signed)
Immediate Anesthesia Transfer of Care Note  Patient: Emily Macdonald  Procedure(s) Performed: Procedure(s) (LRB) with comments: LAPAROSCOPIC CHOLECYSTECTOMY WITH INTRAOPERATIVE CHOLANGIOGRAM (N/A)  Patient Location: PACU  Anesthesia Type:General  Level of Consciousness: awake, alert  and oriented  Airway & Oxygen Therapy: Patient Spontanous Breathing and Patient connected to face mask oxygen  Post-op Assessment: Report given to PACU RN, Post -op Vital signs reviewed and stable and Patient moving all extremities X 4  Post vital signs: Reviewed and stable  Complications: No apparent anesthesia complications

## 2011-12-28 ENCOUNTER — Encounter (HOSPITAL_COMMUNITY): Payer: Self-pay | Admitting: Surgery

## 2011-12-28 NOTE — ED Provider Notes (Signed)
Medical screening examination/treatment/procedure(s) were performed by non-physician practitioner and as supervising physician I was immediately available for consultation/collaboration.   Laray Anger, DO 12/28/11 1524

## 2011-12-28 NOTE — Care Management Note (Signed)
    Page 1 of 1   12/28/2011     4:19:25 PM   CARE MANAGEMENT NOTE 12/28/2011  Patient:  Emily Macdonald, Emily Macdonald   Account Number:  [id number]  Date Initiated:    Documentation initiated by:    Subjective/Objective Assessment:     Action/Plan:   Anticipated DC Date:  12/28/2011   Anticipated DC Plan:  HOME/SELF CARE         Choice offered to / List presented to:             Status of service:  Completed, signed off Medicare Important Message given?   (If response is "NO", the following Medicare IM given date fields will be blank) Date Medicare IM given:   Date Additional Medicare IM given:    Discharge Disposition:  HOME/SELF CARE  Per UR Regulation:  Reviewed for med. necessity/level of care/duration of stay  If discussed at Long Length of Stay Meetings, dates discussed:    Comments:

## 2014-04-04 IMAGING — US US ABDOMEN COMPLETE
1 series · 14 of 25 positions shown · non-contrast
Comparison: 01/02/2011

CLINICAL DATA: Right upper quadrant pain

COMPLETE ABDOMINAL ULTRASOUND

[Series 1: us abdomen complete · 0.24mm/px · 14 of 81 slices shown]
[im 1/81]
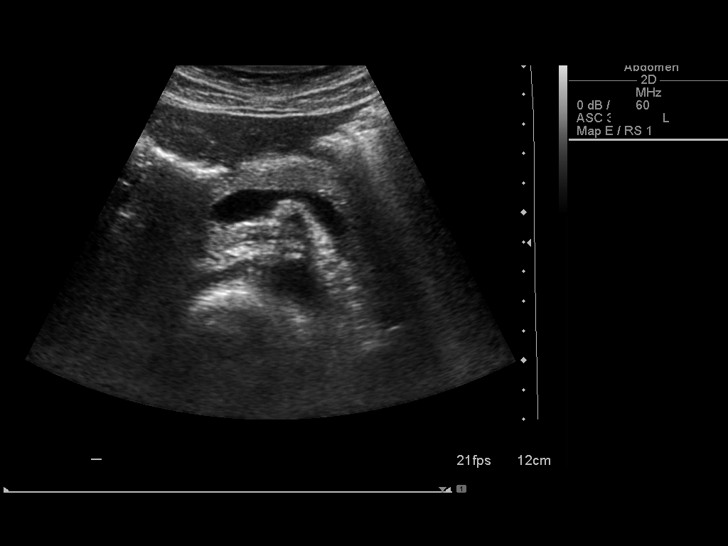
[im 7/81]
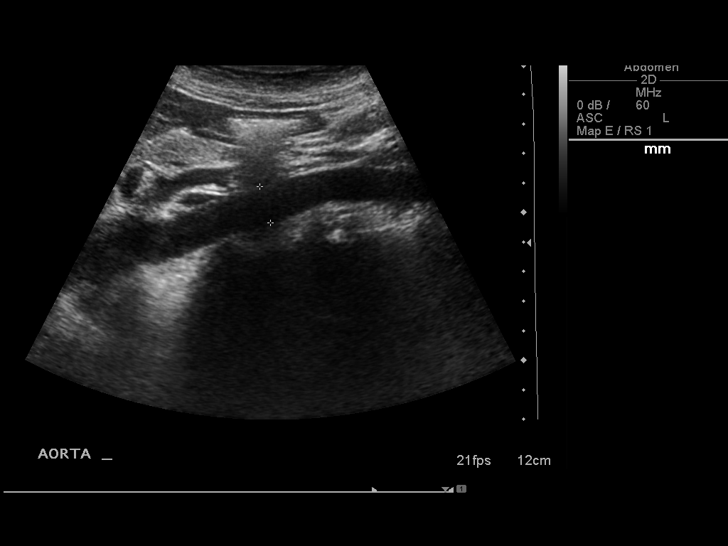
[im 14/81]
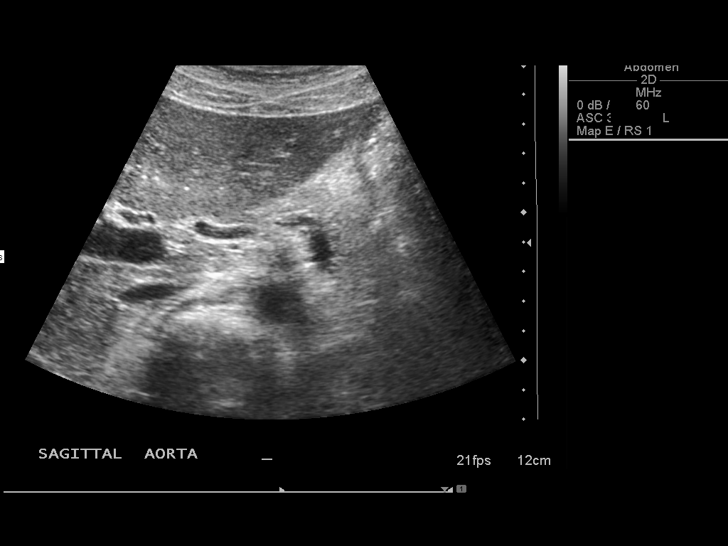
[im 21/81]
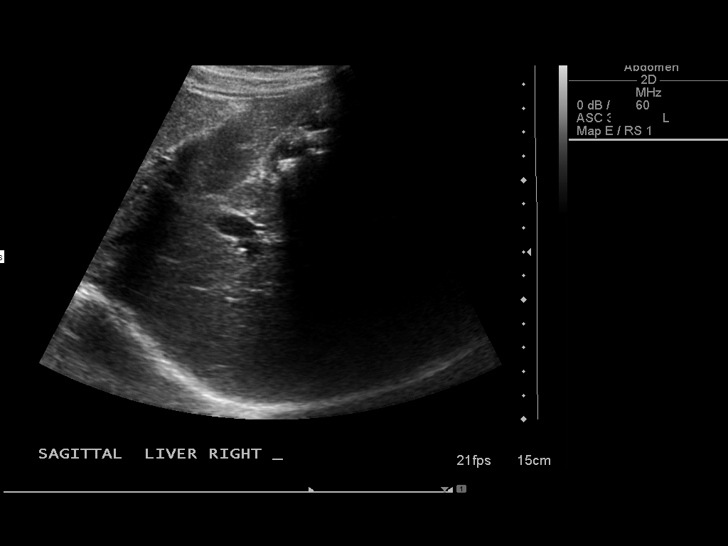
[im 27/81]
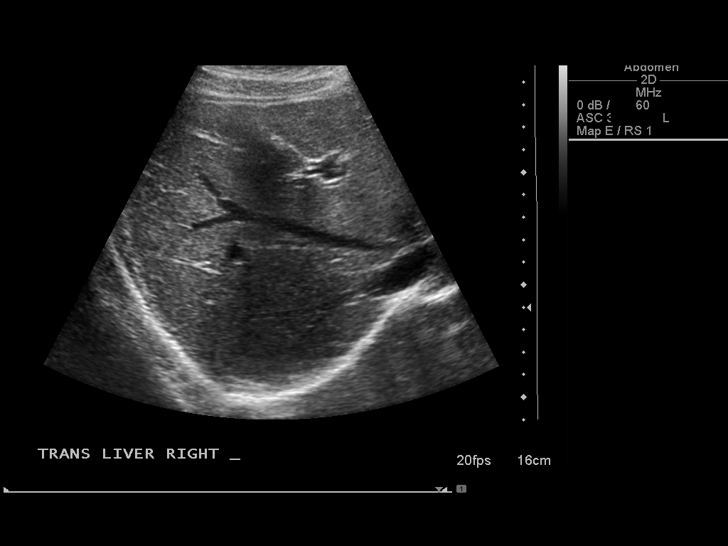
[im 31/81]
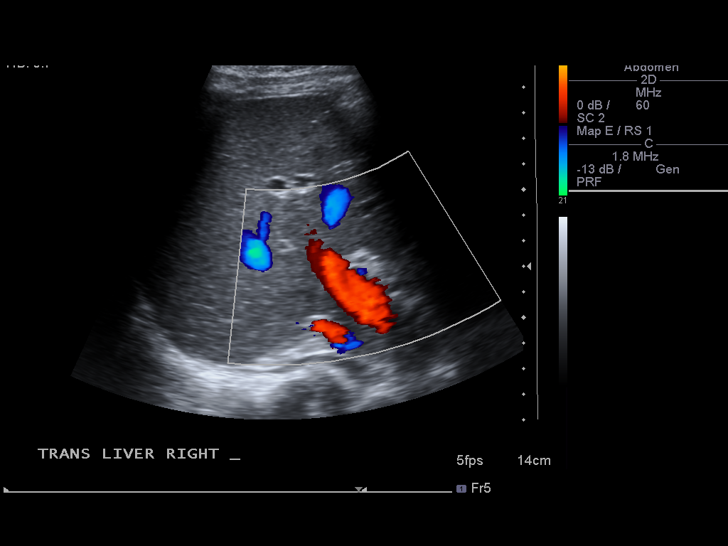
[im 37/81]
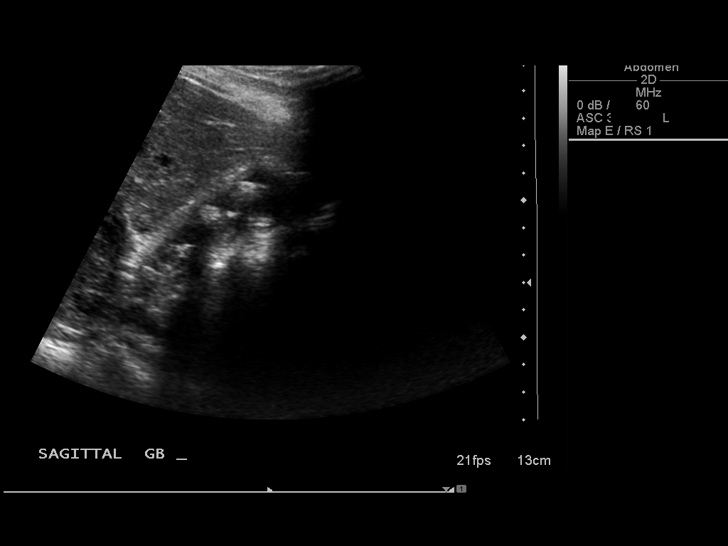
[im 44/81]
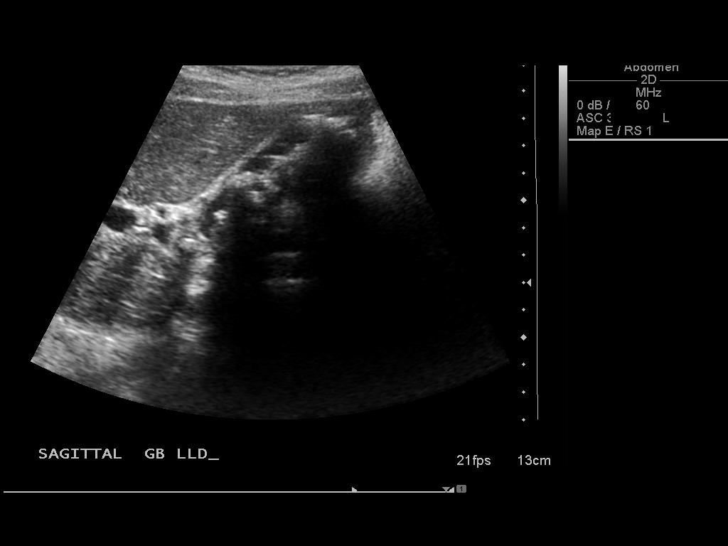
[im 51/81]
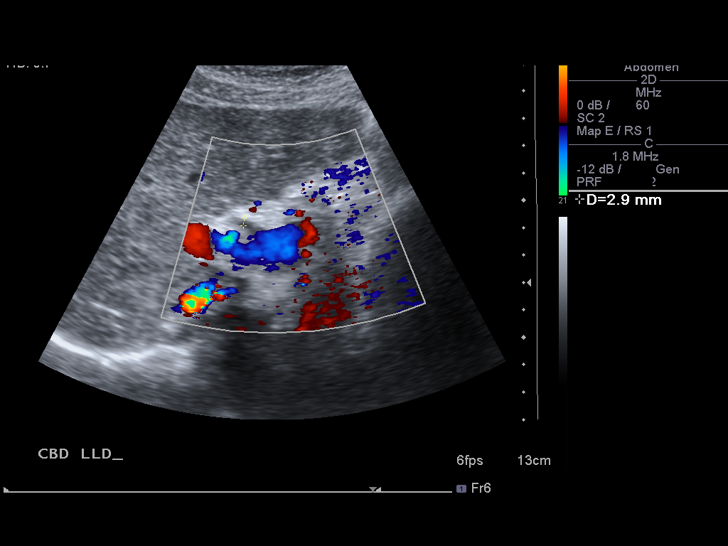
[im 54/81]
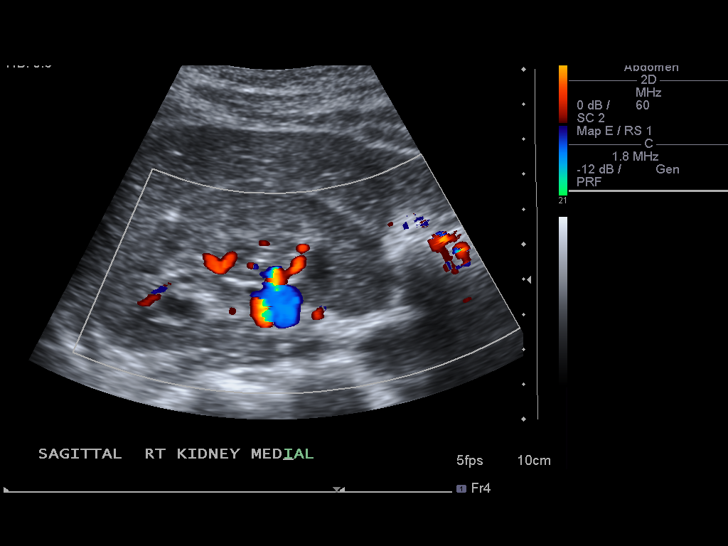
[im 61/81]
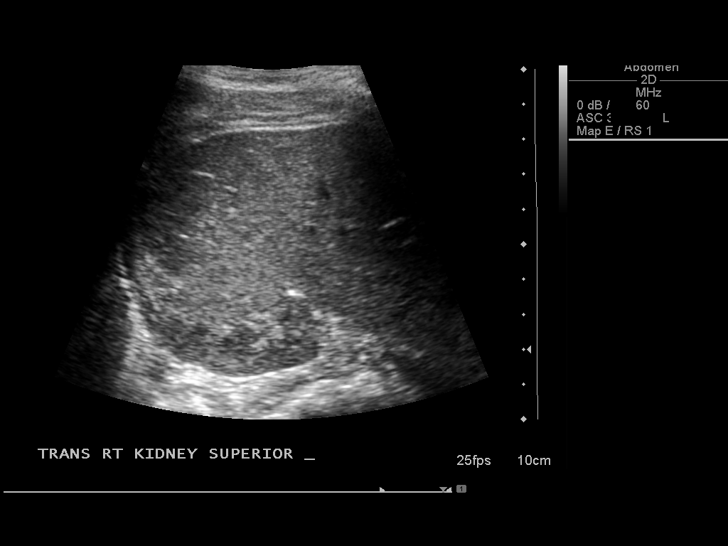
[im 67/81]
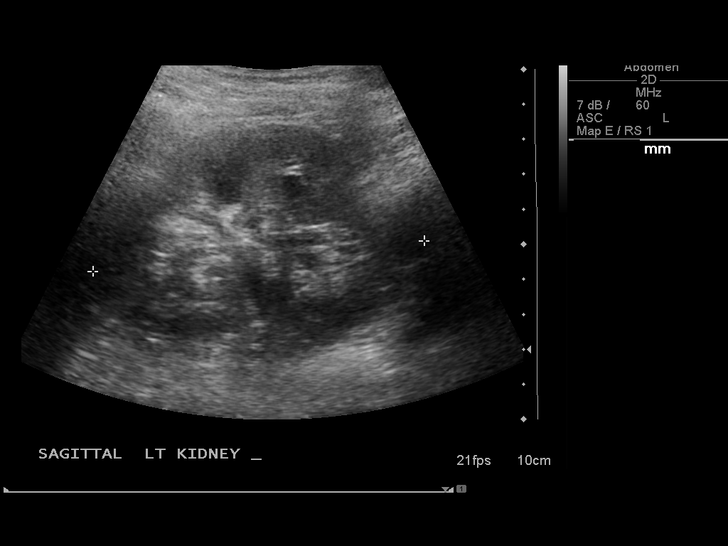
[im 74/81]
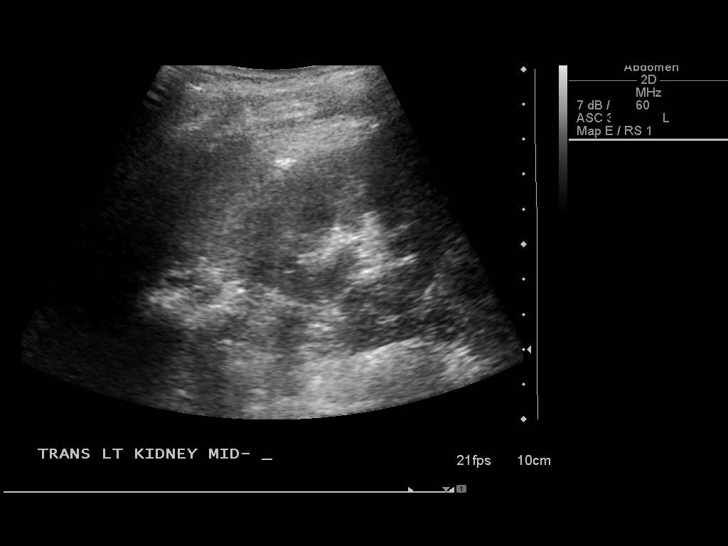
[im 81/81]
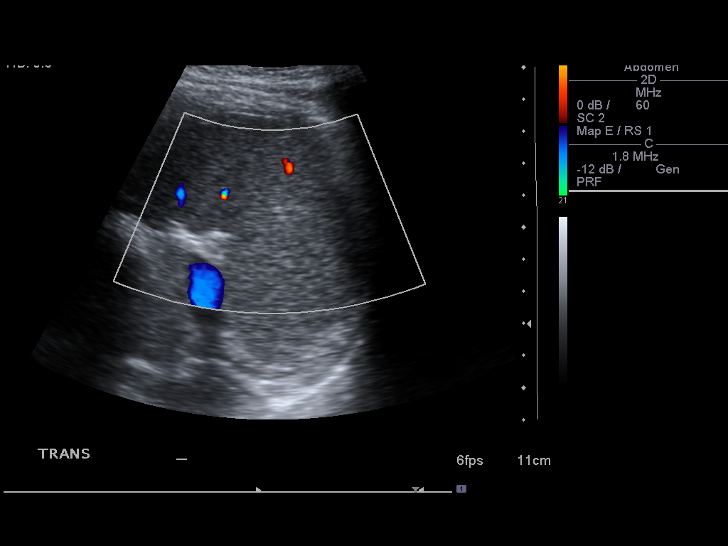

[14 of 25 positions shown; findings below may reference images not displayed]

FINDINGS: Gallbladder:  Gallbladder is filled with stones.  Positive
sonographic Murphy's sign.  Gallbladder wall is 4.8 mm which may
reflect acute cholecystitis.

Common bile duct:  2.9 mm

Liver:  No focal lesion identified.  Within normal limits in
parenchymal echogenicity.

IVC:  Appears normal.

Pancreas:  No focal abnormality seen.

Spleen:  5.7 cm

Right Kidney:  9.1 cm.  Normal

Left Kidney:  9.5 cm.  Normal

Abdominal aorta:  No aneurysm identified.
IMPRESSION: Cholelithiasis with gallbladder wall thickening and positive
sonographic Murphy's sign.  This suggests acute cholecystitis.

## 2014-04-05 IMAGING — RF DG CHOLANGIOGRAM OPERATIVE
1 series · 4 of 4 positions shown · non-contrast
Comparison: none

Intraoperative cholangiogram
HISTORY: Cholelithiasis

[Series 1: run · 4 of 95 frames shown]
[frame 15/95]
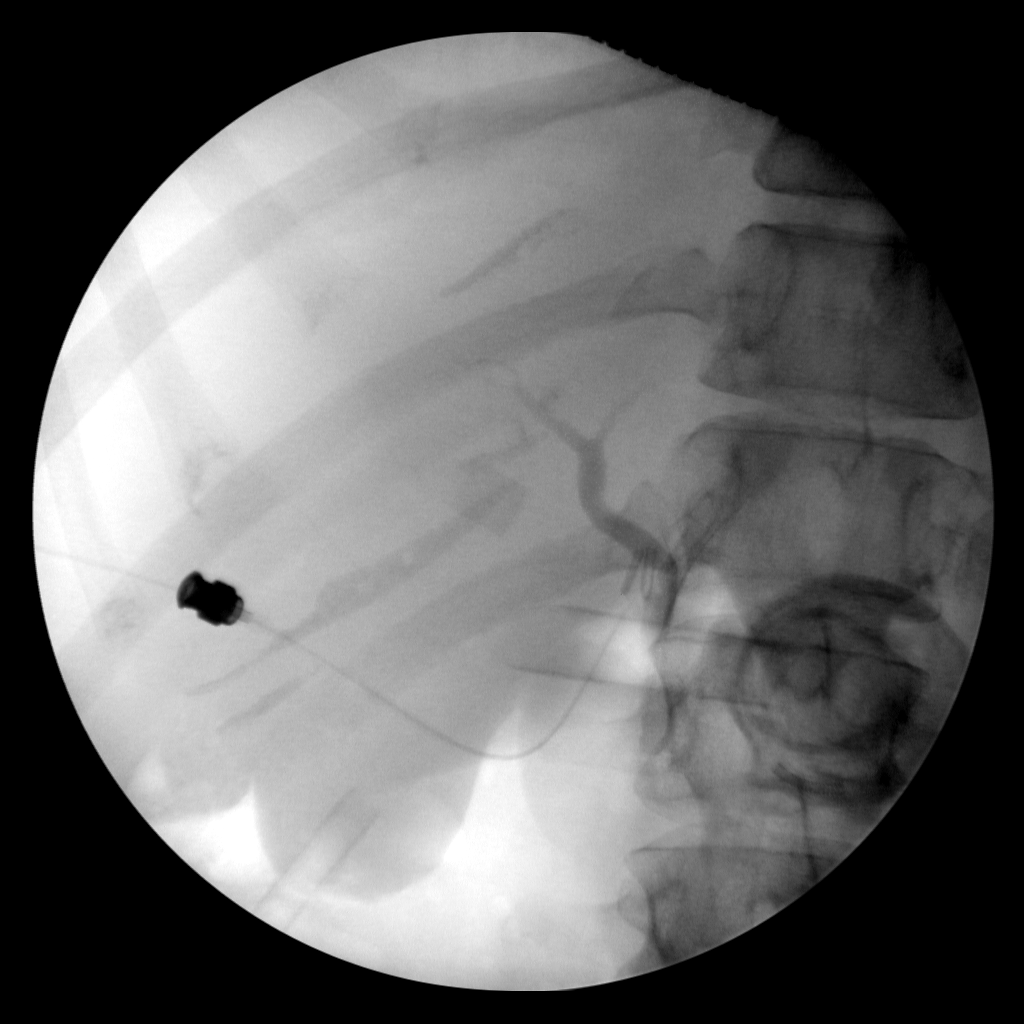
[frame 48/95]
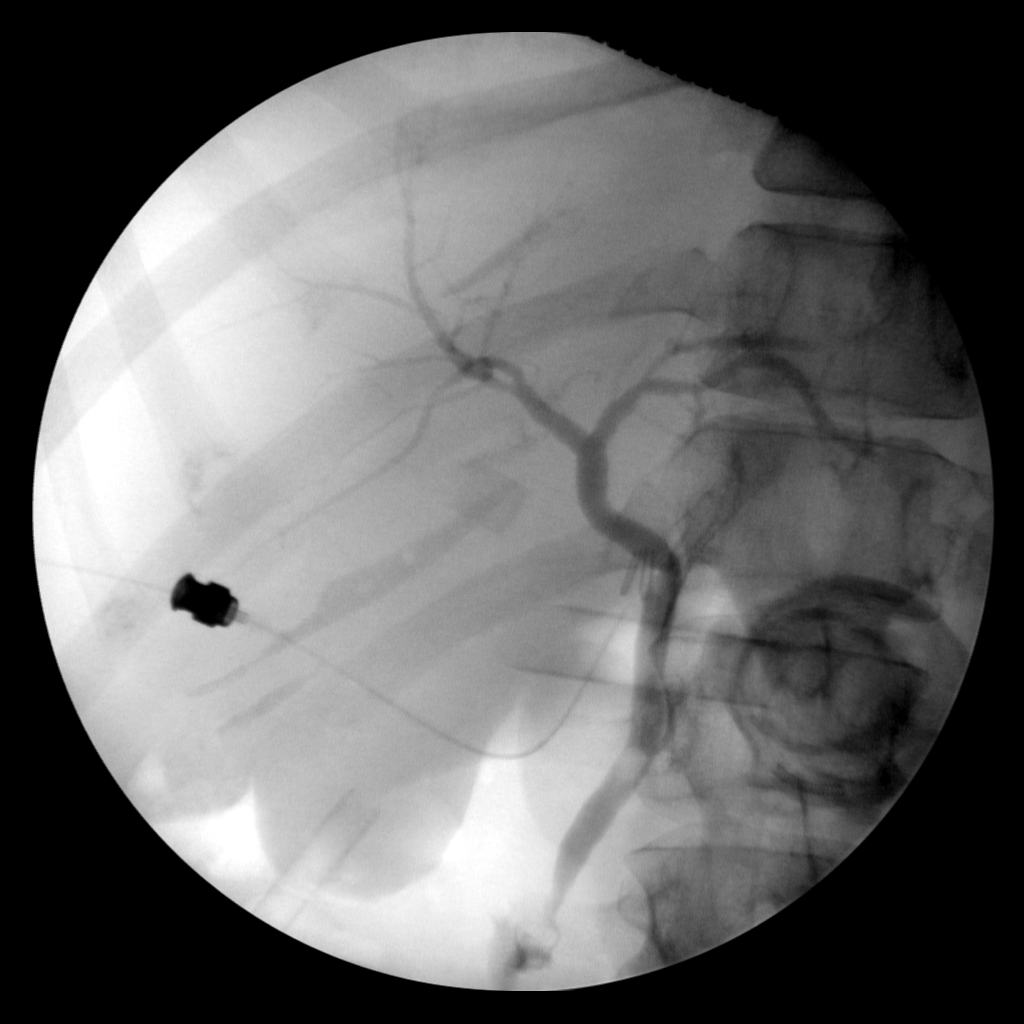
[frame 81/95]
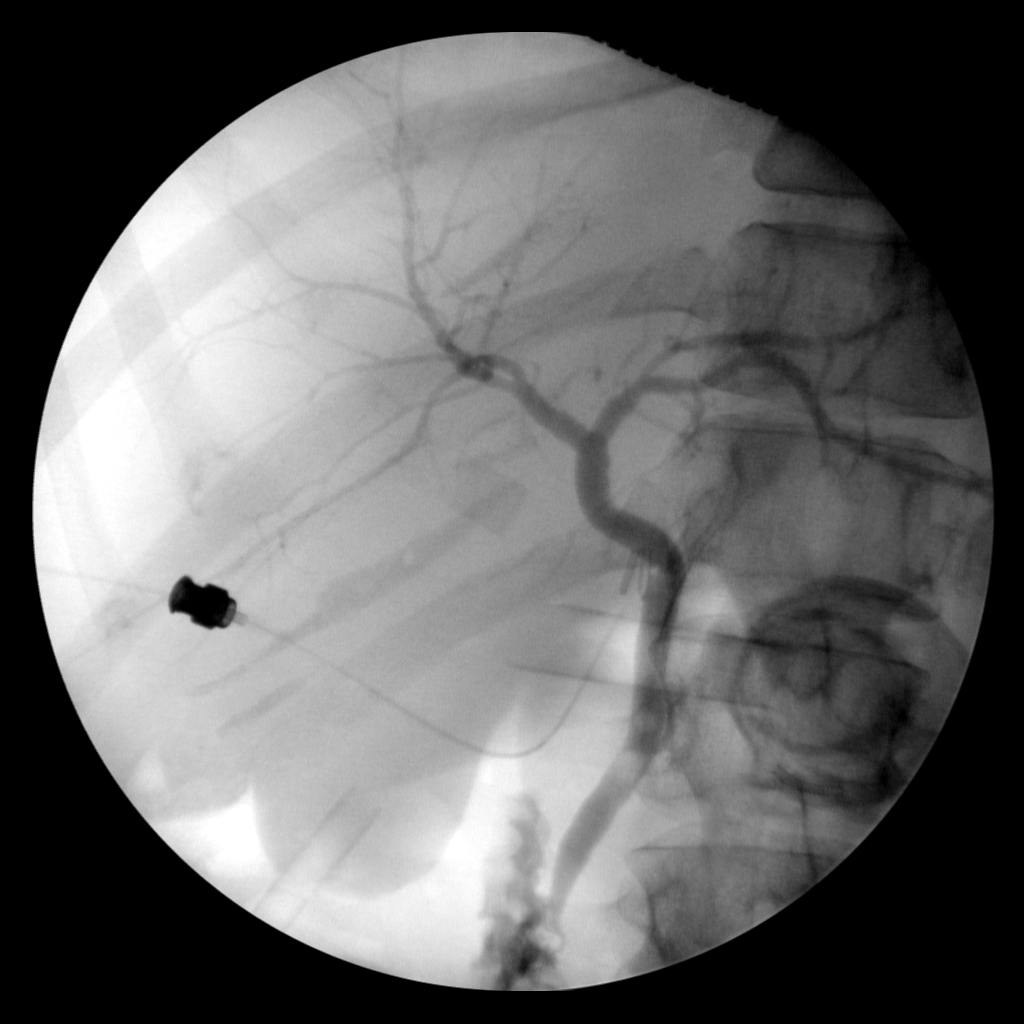
[frame 94/95]
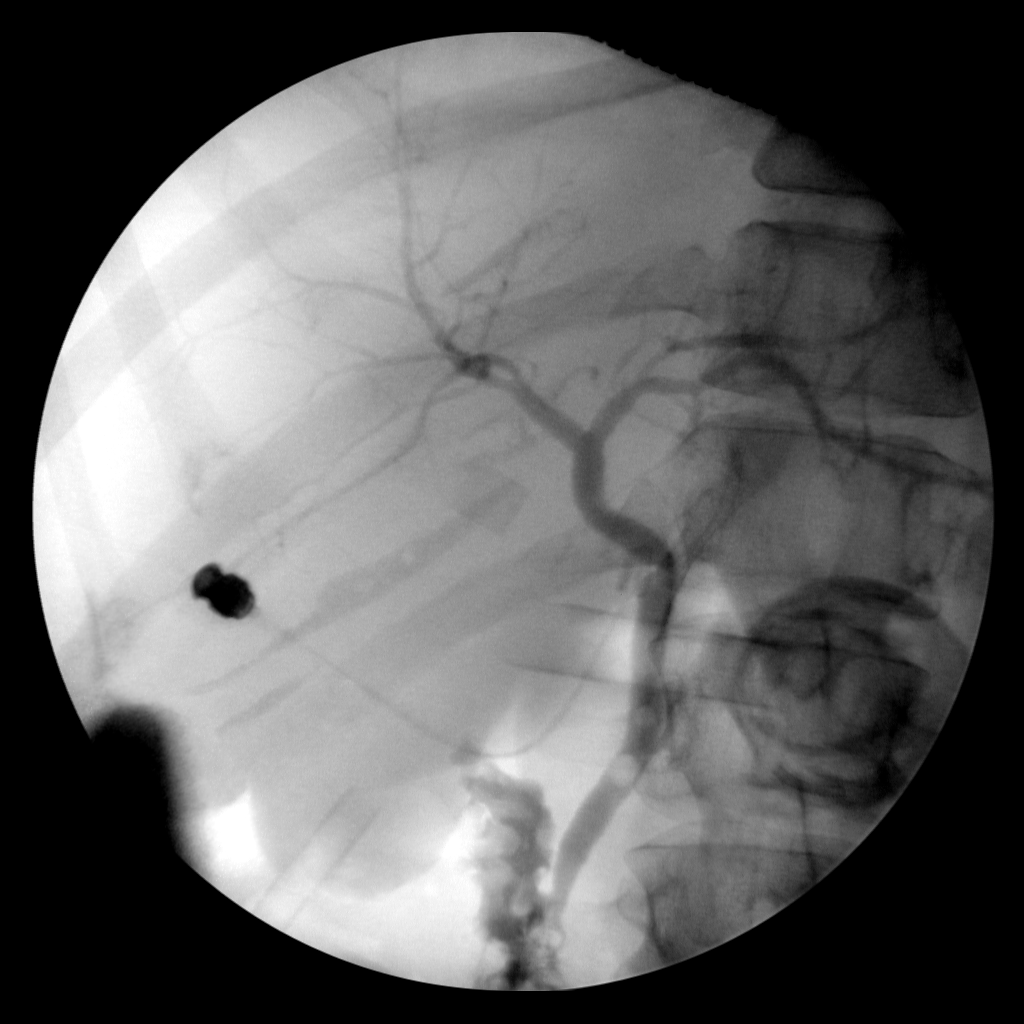

[4 of 4 positions shown; findings below may reference images not displayed]

FINDINGS: The gallbladder is been removed, and the cystic duct has
been cannulated.  The intrahepatic biliary ducts appear normal.
The common hepatic and common bile ducts are not enlarged.  There
are two rounded filling defects in the mid common bile duct
suspicious for calculi.  These calculi are not causing complete
obstruction; there is apparent free flow of contrast via the common
bile duct into the duodenum.  The distal common bile duct appears
normal.  No masses are seen.
CONCLUSION: Two filling defects in the mid common bile duct, felt
to represent nonobstructing calculi. No complete obstruction.
Study otherwise unremarkable.

## 2016-08-31 ENCOUNTER — Ambulatory Visit (INDEPENDENT_AMBULATORY_CARE_PROVIDER_SITE_OTHER): Payer: Self-pay | Admitting: Family

## 2016-08-31 ENCOUNTER — Encounter: Payer: Self-pay | Admitting: Family

## 2016-08-31 VITALS — BP 115/69 | HR 108 | Temp 98.5°F | Ht 67.0 in | Wt 142.2 lb

## 2016-08-31 DIAGNOSIS — L255 Unspecified contact dermatitis due to plants, except food: Secondary | ICD-10-CM

## 2016-08-31 MED ORDER — METHYLPREDNISOLONE ACETATE 80 MG/ML IJ SUSP
80.0000 mg | Freq: Once | INTRAMUSCULAR | Status: AC
  Administered 2016-08-31: 80 mg via INTRAMUSCULAR

## 2016-08-31 MED ORDER — PREDNISONE 10 MG (21) PO TBPK: ORAL_TABLET | ORAL | 0 refills | Status: AC

## 2016-08-31 MED ORDER — TRIAMCINOLONE ACETONIDE 0.5 % EX OINT
1.0000 | TOPICAL_OINTMENT | Freq: Two times a day (BID) | CUTANEOUS | 0 refills | Status: AC
Start: 2016-08-31 — End: ?

## 2016-08-31 NOTE — Progress Notes (Signed)
   Subjective:    Patient ID: Emily Macdonald, female    DOB: April 09, 1975, 41 y.o.   MRN: [id number]  Rash  This is a new problem. The current episode started in the past 7 days. The problem has been rapidly worsening since onset. The affected locations include the face, left arm and right arm. The rash is characterized by redness and itchiness. She was exposed to plant contact. Pertinent negatives include no congestion, diarrhea, fatigue or sore throat. Past treatments include moisturizer, cold compress, anti-itch cream, antibiotic cream and antihistamine. The treatment provided mild relief.      Review of Systems  Constitutional: Negative for fatigue.  HENT: Negative for congestion and sore throat.   Gastrointestinal: Negative for diarrhea.  Skin: Positive for rash.  All other systems reviewed and are negative.      Objective:   Physical Exam  Constitutional: She is oriented to person, place, and time. She appears well-developed and well-nourished. No distress.  HENT:  Head: Normocephalic.  Eyes: Pupils are equal, round, and reactive to light.  Neck: Normal range of motion. Neck supple. No thyromegaly present.  Cardiovascular: Normal rate, regular rhythm, normal heart sounds and intact distal pulses.   No murmur heard. Pulmonary/Chest: Effort normal and breath sounds normal. No respiratory distress. She has no wheezes.  Abdominal: Soft. Bowel sounds are normal. She exhibits no distension. There is no tenderness.  Musculoskeletal: Normal range of motion. She exhibits no edema or tenderness.  Neurological: She is alert and oriented to person, place, and time.  Skin: Skin is warm and dry. Rash noted. There is erythema.  Erythemas blister rash on bilateral forearm, oozing yellow serous discharge  Erythemas blisters on right forearm and right eye lid   Psychiatric: She has a normal mood and affect. Her behavior is normal. Judgment and thought content normal.  Vitals  reviewed.         BP 115/69   Pulse (!) 108   Temp 98.5 F (36.9 C) (Oral)   Ht 5\' 7"  (1.702 m)   Wt 142 lb 3.2 oz (64.5 kg)   LMP 08/23/2016   BMI 22.27 kg/m      Assessment & Plan:  1. Contact dermatitis due to plant Keep clean and dry Do not scratch Report any fever or s/s of infection Wear protective clothing while doing "yard work" RTO prn  - methylPREDNISolone acetate (DEPO-MEDROL) injection 80 mg; Inject 1 mL (80 mg total) into the muscle once. - triamcinolone ointment (KENALOG) 0.5 %; Apply 1 application topically 2 (two) times daily.  Dispense: 30 g; Refill: 0 - predniSONE (STERAPRED UNI-PAK 21 TAB) 10 MG (21) TBPK tablet; Use as directed  Dispense: 21 tablet; Refill: 0    Jannifer Rodneyhristy Derelle Cockrell, FNP

## 2016-08-31 NOTE — Patient Instructions (Signed)
Poison Ivy Dermatitis Poison ivy dermatitis is inflammation of the skin that is caused by the allergens on the leaves of the poison ivy plant. The skin reaction often involves redness, swelling, blisters, and extreme itching. What are the causes? This condition is caused by a specific chemical (urushiol) found in the sap of the poison ivy plant. This chemical is sticky and can be easily spread to people, animals, and objects. You can get poison ivy dermatitis by:  Having direct contact with a poison ivy plant.  Touching animals, other people, or objects that have come in contact with poison ivy and have the chemical on them.  What increases the risk? This condition is more likely to develop in:  People who are outdoors often.  People who go outdoors without wearing protective clothing, such as closed shoes, long pants, and a long-sleeved shirt.  What are the signs or symptoms? Symptoms of this condition include:  Redness and itching.  A rash that often includes bumps and blisters. The rash usually appears 48 hours after exposure.  Swelling. This may occur if the reaction is more severe.  Symptoms usually last for 1-2 weeks. However, the first time you develop this condition, symptoms may last 3-4 weeks. How is this diagnosed? This condition may be diagnosed based on your symptoms and a physical exam. Your health care provider may also ask you about any recent outdoor activity. How is this treated? Treatment for this condition will vary depending on how severe it is. Treatment may include:  Hydrocortisone creams or calamine lotions to relieve itching.  Oatmeal baths to soothe the skin.  Over-the-counter antihistamine tablets.  Oral steroid medicine for more severe outbreaks.  Follow these instructions at home:  Take or apply over-the-counter and prescription medicines only as told by your health care provider.  Wash exposed skin as soon as possible with soap and cold  water.  Use hydrocortisone creams or calamine lotion as needed to soothe the skin and relieve itching.  Take oatmeal baths as needed. Use colloidal oatmeal. You can get this at your local pharmacy or grocery store. Follow the instructions on the packaging.  Do not scratch or rub your skin.  While you have the rash, wash clothes right after you wear them. How is this prevented?  Learn to identify the poison ivy plant and avoid contact with the plant. This plant can be recognized by the number of leaves. Generally, poison ivy has three leaves with flowering branches on a single stem. The leaves are typically glossy, and they have jagged edges that come to a point at the front.  If you have been exposed to poison ivy, thoroughly wash with soap and water right away. You have about 30 minutes to remove the plant resin before it will cause the rash. Be sure to wash under your fingernails because any plant resin there will continue to spread the rash.  When hiking or camping, wear clothes that will help you to avoid exposure on the skin. This includes long pants, a long-sleeved shirt, tall socks, and hiking boots. You can also apply preventive lotion to your skin to help limit exposure.  If you suspect that your clothes or outdoor gear came in contact with poison ivy, rinse them off outside with a garden hose before you bring them inside your house. Contact a health care provider if:  You have open sores in the rash area.  You have more redness, swelling, or pain in the affected area.  You have   redness that spreads beyond the rash area.  You have fluid, blood, or pus coming from the affected area.  You have a fever.  You have a rash over a large area of your body.  You have a rash on your eyes, mouth, or genitals.  Your rash does not improve after a few days. Get help right away if:  Your face swells or your eyes swell shut.  You have trouble breathing.  You have trouble  swallowing. This information is not intended to replace advice given to you by your health care provider. Make sure you discuss any questions you have with your health care provider. Document Released: 01/07/2000 Document Revised: 06/17/2015 Document Reviewed: 06/17/2014 [employer] Interactive Patient Education  2018 [employer]
